# Patient Record
Sex: Male | Born: 1965
Health system: Southern US, Community
[De-identification: ages and names within clinical notes are randomized; demographics above are authoritative.]

## PROBLEM LIST (undated history)

## (undated) DIAGNOSIS — N400 Enlarged prostate without lower urinary tract symptoms: Secondary | ICD-10-CM

## (undated) DIAGNOSIS — F411 Generalized anxiety disorder: Secondary | ICD-10-CM

## (undated) DIAGNOSIS — L309 Dermatitis, unspecified: Secondary | ICD-10-CM

## (undated) DIAGNOSIS — Z87442 Personal history of urinary calculi: Secondary | ICD-10-CM

## (undated) DIAGNOSIS — Z8709 Personal history of other diseases of the respiratory system: Secondary | ICD-10-CM

## (undated) DIAGNOSIS — K429 Umbilical hernia without obstruction or gangrene: Secondary | ICD-10-CM

## (undated) DIAGNOSIS — K219 Gastro-esophageal reflux disease without esophagitis: Secondary | ICD-10-CM

## (undated) DIAGNOSIS — G8929 Other chronic pain: Secondary | ICD-10-CM

## (undated) DIAGNOSIS — N411 Chronic prostatitis: Secondary | ICD-10-CM

## (undated) DIAGNOSIS — Z8719 Personal history of other diseases of the digestive system: Secondary | ICD-10-CM

## (undated) DIAGNOSIS — R399 Unspecified symptoms and signs involving the genitourinary system: Secondary | ICD-10-CM

## (undated) DIAGNOSIS — Z973 Presence of spectacles and contact lenses: Secondary | ICD-10-CM

## (undated) DIAGNOSIS — K649 Unspecified hemorrhoids: Secondary | ICD-10-CM

## (undated) HISTORY — PX: LAPAROSCOPIC CHOLECYSTECTOMY: SUR755

## (undated) HISTORY — PX: LAPAROSCOPIC INGUINAL HERNIA REPAIR: SUR788

## (undated) HISTORY — PX: NISSEN FUNDOPLICATION: SHX2091

## (undated) HISTORY — PX: HERNIA REPAIR: SHX51

## (undated) HISTORY — PX: CHOLECYSTECTOMY: SHX55

## (undated) HISTORY — DX: Gastro-esophageal reflux disease without esophagitis: K21.9

## (undated) HISTORY — PX: INGUINAL HERNIA REPAIR: SUR1180

## (undated) HISTORY — PX: LAPAROSCOPIC NISSEN FUNDOPLICATION: SHX1932

---

## 1999-10-14 ENCOUNTER — Emergency Department (HOSPITAL_COMMUNITY): Admission: EM | Admit: 1999-10-14 | Discharge: 1999-10-14 | Payer: Self-pay | Admitting: *Deleted

## 2000-02-10 ENCOUNTER — Ambulatory Visit (HOSPITAL_COMMUNITY): Admission: RE | Admit: 2000-02-10 | Discharge: 2000-02-10 | Payer: Self-pay | Admitting: Gastroenterology

## 2001-05-21 ENCOUNTER — Ambulatory Visit (HOSPITAL_COMMUNITY): Admission: RE | Admit: 2001-05-21 | Discharge: 2001-05-21 | Payer: Self-pay | Admitting: Gastroenterology

## 2001-05-24 ENCOUNTER — Ambulatory Visit (HOSPITAL_COMMUNITY): Admission: RE | Admit: 2001-05-24 | Discharge: 2001-05-24 | Payer: Self-pay | Admitting: Gastroenterology

## 2001-06-15 ENCOUNTER — Ambulatory Visit (HOSPITAL_COMMUNITY): Admission: RE | Admit: 2001-06-15 | Discharge: 2001-06-15 | Payer: Self-pay | Admitting: Gastroenterology

## 2001-06-30 ENCOUNTER — Observation Stay (HOSPITAL_COMMUNITY): Admission: RE | Admit: 2001-06-30 | Discharge: 2001-07-01 | Payer: Self-pay | Admitting: Surgery

## 2001-06-30 ENCOUNTER — Encounter (INDEPENDENT_AMBULATORY_CARE_PROVIDER_SITE_OTHER): Payer: Self-pay | Admitting: *Deleted

## 2003-02-15 ENCOUNTER — Encounter: Payer: Self-pay | Admitting: Emergency Medicine

## 2003-02-15 ENCOUNTER — Emergency Department (HOSPITAL_COMMUNITY): Admission: EM | Admit: 2003-02-15 | Discharge: 2003-02-15 | Payer: Self-pay | Admitting: Emergency Medicine

## 2003-12-20 ENCOUNTER — Encounter: Admission: RE | Admit: 2003-12-20 | Discharge: 2003-12-20 | Payer: Self-pay | Admitting: Family Medicine

## 2004-02-15 ENCOUNTER — Emergency Department (HOSPITAL_COMMUNITY): Admission: EM | Admit: 2004-02-15 | Discharge: 2004-02-16 | Payer: Self-pay | Admitting: Emergency Medicine

## 2004-02-19 ENCOUNTER — Encounter: Admission: RE | Admit: 2004-02-19 | Discharge: 2004-02-19 | Payer: Self-pay | Admitting: Neurology

## 2004-03-12 ENCOUNTER — Encounter: Admission: RE | Admit: 2004-03-12 | Discharge: 2004-03-12 | Payer: Self-pay | Admitting: Neurology

## 2004-03-27 ENCOUNTER — Encounter: Admission: RE | Admit: 2004-03-27 | Discharge: 2004-03-27 | Payer: Self-pay | Admitting: Neurology

## 2005-05-05 DIAGNOSIS — Z9889 Other specified postprocedural states: Secondary | ICD-10-CM

## 2005-05-05 DIAGNOSIS — Z8719 Personal history of other diseases of the digestive system: Secondary | ICD-10-CM

## 2005-05-05 HISTORY — DX: Other specified postprocedural states: Z98.890

## 2005-05-05 HISTORY — DX: Personal history of other diseases of the digestive system: Z87.19

## 2005-11-29 ENCOUNTER — Emergency Department (HOSPITAL_COMMUNITY): Admission: EM | Admit: 2005-11-29 | Discharge: 2005-11-29 | Payer: Self-pay | Admitting: Emergency Medicine

## 2005-12-22 ENCOUNTER — Ambulatory Visit (HOSPITAL_COMMUNITY): Admission: RE | Admit: 2005-12-22 | Discharge: 2005-12-22 | Payer: Self-pay | Admitting: Gastroenterology

## 2005-12-22 ENCOUNTER — Ambulatory Visit: Payer: Self-pay | Admitting: Gastroenterology

## 2005-12-24 ENCOUNTER — Ambulatory Visit (HOSPITAL_COMMUNITY): Admission: RE | Admit: 2005-12-24 | Discharge: 2005-12-24 | Payer: Self-pay | Admitting: Gastroenterology

## 2005-12-25 ENCOUNTER — Encounter (INDEPENDENT_AMBULATORY_CARE_PROVIDER_SITE_OTHER): Payer: Self-pay | Admitting: Gastroenterology

## 2005-12-25 ENCOUNTER — Ambulatory Visit: Payer: Self-pay | Admitting: Gastroenterology

## 2006-01-08 ENCOUNTER — Ambulatory Visit (HOSPITAL_COMMUNITY): Admission: RE | Admit: 2006-01-08 | Discharge: 2006-01-08 | Payer: Self-pay | Admitting: Gastroenterology

## 2006-01-14 ENCOUNTER — Ambulatory Visit: Payer: Self-pay | Admitting: Gastroenterology

## 2006-01-15 ENCOUNTER — Ambulatory Visit: Payer: Self-pay | Admitting: Internal Medicine

## 2006-01-19 ENCOUNTER — Ambulatory Visit (HOSPITAL_COMMUNITY): Admission: RE | Admit: 2006-01-19 | Discharge: 2006-01-19 | Payer: Self-pay | Admitting: Internal Medicine

## 2006-01-28 ENCOUNTER — Ambulatory Visit: Payer: Self-pay | Admitting: Cardiovascular Disease

## 2006-01-30 ENCOUNTER — Ambulatory Visit: Payer: Self-pay

## 2006-02-02 ENCOUNTER — Ambulatory Visit (HOSPITAL_COMMUNITY): Admission: RE | Admit: 2006-02-02 | Discharge: 2006-02-02 | Payer: Self-pay | Admitting: Gastroenterology

## 2006-02-03 ENCOUNTER — Ambulatory Visit (HOSPITAL_COMMUNITY): Admission: RE | Admit: 2006-02-03 | Discharge: 2006-02-03 | Payer: Self-pay | Admitting: Gastroenterology

## 2006-02-28 ENCOUNTER — Inpatient Hospital Stay (HOSPITAL_COMMUNITY): Admission: RE | Admit: 2006-02-28 | Discharge: 2006-03-03 | Payer: Self-pay | Admitting: Surgery

## 2006-03-04 ENCOUNTER — Ambulatory Visit: Payer: Self-pay | Admitting: Gastroenterology

## 2008-07-31 ENCOUNTER — Observation Stay (HOSPITAL_COMMUNITY): Admission: EM | Admit: 2008-07-31 | Discharge: 2008-08-01 | Payer: Self-pay | Admitting: Emergency Medicine

## 2008-07-31 ENCOUNTER — Ambulatory Visit: Payer: Self-pay | Admitting: Cardiology

## 2008-08-01 ENCOUNTER — Encounter (INDEPENDENT_AMBULATORY_CARE_PROVIDER_SITE_OTHER): Payer: Self-pay | Admitting: Internal Medicine

## 2008-08-09 ENCOUNTER — Emergency Department (HOSPITAL_COMMUNITY): Admission: EM | Admit: 2008-08-09 | Discharge: 2008-08-09 | Payer: Self-pay | Admitting: Emergency Medicine

## 2010-05-05 HISTORY — PX: LIPOMA EXCISION: SHX5283

## 2010-08-15 LAB — COMPREHENSIVE METABOLIC PANEL
Albumin: 3.7 g/dL (ref 3.5–5.2)
Albumin: 4.1 g/dL (ref 3.5–5.2)
Alkaline Phosphatase: 44 U/L (ref 39–117)
BUN: 10 mg/dL (ref 6–23)
CO2: 28 mEq/L (ref 19–32)
Chloride: 108 mEq/L (ref 96–112)
Creatinine, Ser: 1.38 mg/dL (ref 0.4–1.5)
GFR calc Af Amer: >60 mL/min (ref >60)
GFR calc non Af Amer: 57 mL/min — ABNORMAL LOW (ref >60)
Sodium: 138 mEq/L (ref 135–145)
Total Bilirubin: 0.6 mg/dL (ref 0.3–1.2)
Total Bilirubin: 0.8 mg/dL (ref 0.3–1.2)
Total Protein: 6.3 g/dL (ref 6.0–8.3)

## 2010-08-15 LAB — URINE MICROSCOPIC-ADD ON

## 2010-08-15 LAB — CBC
HCT: 46.8 % (ref 39.0–52.0)
Hemoglobin: 15.9 g/dL (ref 13.0–17.0)
Platelets: 192 10*3/uL (ref 150–400)
RDW: 13.4 % (ref 11.5–15.5)
WBC: 7.3 10*3/uL (ref 4.0–10.5)

## 2010-08-15 LAB — DIFFERENTIAL
Basophils Absolute: 0 10*3/uL (ref 0.0–0.1)
Basophils Relative: 1 % (ref 0–1)
Eosinophils Relative: 5 % (ref 0–5)
Lymphocytes Relative: 25 % (ref 12–46)
Lymphocytes Relative: 28 % (ref 12–46)
Lymphs Abs: 1.8 10*3/uL (ref 0.7–4.0)
Monocytes Absolute: 0.6 10*3/uL (ref 0.1–1.0)
Monocytes Absolute: 0.7 10*3/uL (ref 0.1–1.0)
Monocytes Relative: 10 % (ref 3–12)
Neutro Abs: 4.8 10*3/uL (ref 1.7–7.7)
Neutrophils Relative %: 59 % (ref 43–77)

## 2010-08-15 LAB — CARDIAC PANEL(CRET KIN+CKTOT+MB+TROPI)
CK, MB: 1.2 ng/mL (ref 0.3–4.0)
CK, MB: 1.2 ng/mL (ref 0.3–4.0)
Relative Index: 1.2 (ref 0.0–2.5)
Relative Index: 1.2 (ref 0.0–2.5)
Total CK: 100 U/L (ref 7–232)
Total CK: 101 U/L (ref 7–232)
Troponin I: 0.01 ng/mL (ref 0.00–0.06)
Troponin I: 0.02 ng/mL (ref 0.00–0.06)

## 2010-08-15 LAB — BRAIN NATRIURETIC PEPTIDE: Pro B Natriuretic peptide (BNP): <30 pg/mL (ref 0.0–100.0)

## 2010-08-15 LAB — URINALYSIS, ROUTINE W REFLEX MICROSCOPIC
Bilirubin Urine: NEGATIVE
Glucose, UA: NEGATIVE mg/dL
Hgb urine dipstick: NEGATIVE
Specific Gravity, Urine: 1.013 (ref 1.005–1.030)
pH: 7.5 (ref 5.0–8.0)

## 2010-08-15 LAB — POCT I-STAT, CHEM 8
BUN: 12 mg/dL (ref 6–23)
Chloride: 105 mEq/L (ref 96–112)
Potassium: 3.9 mEq/L (ref 3.5–5.1)
Sodium: 140 mEq/L (ref 135–145)
TCO2: 23 mmol/L (ref 0–100)

## 2010-08-15 LAB — TROPONIN I: Troponin I: 0.02 ng/mL (ref 0.00–0.06)

## 2010-08-15 LAB — PROTIME-INR: Prothrombin Time: 13.2 seconds (ref 11.6–15.2)

## 2010-08-15 LAB — POCT CARDIAC MARKERS: Troponin i, poc: <0.05 ng/mL (ref 0.00–0.09)

## 2010-08-15 LAB — CK TOTAL AND CKMB (NOT AT ARMC): Relative Index: 1.2 (ref 0.0–2.5)

## 2010-08-15 LAB — AMYLASE: Amylase: 48 U/L (ref 27–131)

## 2010-08-15 LAB — MAGNESIUM: Magnesium: 2.1 mg/dL (ref 1.5–2.5)

## 2010-09-17 NOTE — H&P (Signed)
[name], [name]               ACCOUNT NO.:  [id number]   MEDICAL RECORD NO.:  [id number]          PATIENT TYPE:  EMS   LOCATION:  ED                           FACILITY:  [facility]   PHYSICIAN:  Ramiro Harvest, MD    DATE OF BIRTH:  [birth year]   DATE OF ADMISSION:  07/31/2008  DATE OF DISCHARGE:                              HISTORY & PHYSICAL   PATIENT'S PRIMARY CARE PHYSICIAN:  C. Duane Lope, M.D. of [facility].   PATIENT'S CARDIOLOGIST:  Veverly Fells. Excell Seltzer, MD of [facility].   PATIENT'S GASTROENTEROLOGIST:  Griffith Citron, M.D.   HISTORY OF PRESENT ILLNESS:  [name] is a 45 year old white  gentleman with history of persisting gastroesophageal reflux disease,  hiatal hernia, history of Nissen fundoplication February 26, 2006 per Dr.  Ezzard Standing, status post cholecystectomy, who presented to the ED with a  several hour history of midsternal/epigastric chest pressure occurring  at rest, nonradiating with associated nausea, pallor, shortness of  breath, palpitations and dizziness.  The patient states that he had  similar symptoms prior to getting his Nissen fundoplication.  The  patient stated that it took a year after the Nissen fundoplication for  his symptoms of back then.  The patient also stated that a few weeks  prior to admission he had some throat clearing that he felt was  secondary to reflux.  The patient denies any fever no chills, no  diarrhea, no emesis, no cough, no melena, no hematemesis, no  hematochezia.  No lower abdominal pain, no weakness, no dysuria, no  focal neurological symptoms.  The patient drove himself to the ED.  In  the ED point of care cardiac markers obtained were negative.  CBC within  normal limits.  I-stat 8 was within normal limits.  EKG with some  nonspecific ST-T wave changes which were unchanged from prior EKG.  The  patient was given some morphine in the ED with no significant relief.  The patient refused an aspirin.  We were  called to admit the patient for  further evaluation and management.   ALLERGIES:  No known drug allergies.  However, the patient states that  he did not tolerate the morphine he was given in the ED too well.   PAST MEDICAL HISTORY.:  1. History of pertinent gastroesophageal reflux disease.  2. Hiatal hernia.  3. Status post laparoscopic Nissen fundoplication per Dr. Ezzard Standing in      February 26, 2006.  4. History of chronic non-bacterial prostatitis, improved per patient.  5. Status post cholecystectomy in 2003.  6. Abdominal hernia repair x2.  7. Questionable anxiety/depression.  The patient however denies this.  8. History of anal fissures which have improved.  9. History of asthmatic irritation secondary to GERD.  10.History of right shoulder ringworm, currently resolved.  11.Status post left-sided resection of lipoma x2 months prior to      admission per Dr. Ezzard Standing.   HOME MEDICATIONS.:  1. Wellbutrin 20 mg p.o. b.i.d.  2. Zegerid 40 mg p.o. daily.  3. Elmiron 100 mg p.o. daily.  4. Selenium one.   FAMILY  HISTORY:  Father alive age 8 or 27 who is healthy.  Mother alive  age 57 with some digestive issues.  One sister alive age 58 with a  history of endometriosis.  Grandparents had hiatal hernia and  gallbladder problems.  No family history of coronary artery disease.   EXAMINATION:  Blood pressure 151/98, pulse of 114 down to 84,  respiratory rate 20.  Satting 100% on room air.  GENERAL:  Patient lying on gurney in no apparent distress.  HEENT: Normocephalic, atraumatic.  Pupils equal, round and reactive to  light and accommodation.  Extraocular movements intact.  Oropharynx is  clear.  No lesions, no exudates.  NECK:  Supple.  No lymphadenopathy.  RESPIRATORY:  Lungs are clear to auscultation bilaterally.  No wheezes,  no crackles, no rhonchi.  CARDIOVASCULAR:  Regular rate, rhythm.  No murmurs, rubs or gallops.  ABDOMEN:  Soft, nontender, nondistended.  Positive bowel  sounds.  EXTREMITIES:  No clubbing, cyanosis or edema.  NEUROLOGICAL:  The patient is alert and oriented x3.  Cranial nerves II-  XII are grossly intact.  No focal deficits.   ADMISSION LABORATORIES:  Point of care cardiac markers:  CK-MB less than  1.0, troponin-I less than 0.05, myoglobin of 70, I-stat 8.  Sodium 140,  potassium 3.9, chloride 105, glucose 100, BUN 12, creatinine 1.5.  CBC  with a white count of 7.3, hemoglobin 15.9, hematocrit 46.8 and an ANC  of 4.4.  Chest x-ray did show no acute findings.  An EKG with  nonspecific ST-T wave abnormalities which is unchanged from prior EKG,  normal sinus rhythm.   ASSESSMENT AND PLAN:  [name] is a 45 year old gentleman with  history of a persistent gastroesophageal reflux disease and hiatal  hernia status post Nissen fundoplication in 2007, no family history of  coronary artery disease, prior tobacco history, presenting to the  emergency department with chest pain/epigastric pain.  1. Chest pain/epigastric pain likely secondary to gastrointestinal      versus acute coronary syndrome.  We will admit the patient to      telemetry.  Cycle cardiac enzymes every 8 hours x3.  Check a TSH.      Check a magnesium level.  Check a BNP. Check a two-dimensional      echocardiogram to rule out left ventricular dysfunction.  Serial      electrocardiograms.  Check a lipase.  Check amylase, check      coagulopathies.  We will place on oxygen, nitroglycerin as needed,      aspirin and Lovenox,  Protonix b.i.d. and a gastrointestinal      cocktail.  If enzymes are negative and some symptomatic improvement      under gastrointestinal cocktail and a proton pump inhibitor, we      will refer for outpatient followup with his gastroenterologist in      the next 1-2 days for further evaluation as the patient states that      he has an appointment with his gastroenterologist on Wednesday,      March 31.  2. Chronic non-bacterial prostatitis.   Continue home dose Elmiron.  3. Hiatal hernia.  Protonix.  4. Status post laparoscopic Nissen fundoplication.  5. Questionable anxiety/depression on Wellbutrin.  However, the      patient states he takes the Wellbutrin to prevent esophageal      spasms.  6. Prophylaxis.  Protonix for gastrointestinal prophylaxis.  Lovenox      for deep vein thrombosis prophylaxis.  Ramiro Harvest, MD  Electronically Signed     DT/MEDQ  D:  07/31/2008  T:  07/31/2008  Job:  [id number]   cc:   C. Duane Lope, M.D.  Fax: [phone]   Veverly Fells. Excell Seltzer, MD  [address]  [place] , [place] [address]   Griffith Citron, M.D.  Fax: [phone]

## 2010-09-17 NOTE — Discharge Summary (Signed)
[name], [name]               ACCOUNT NO.:  [id number]   MEDICAL RECORD NO.:  [id number]          PATIENT TYPE:  OBV   LOCATION:  1414                         FACILITY:  [facility]   PHYSICIAN:  Ramiro Harvest, MD    DATE OF BIRTH:  01-Mar-1966   DATE OF ADMISSION:  07/31/2008  DATE OF DISCHARGE:  08/01/2008                               DISCHARGE SUMMARY   PRIMARY CARE PHYSICIAN:  Dr. Duane Lope of [facility].   GASTROENTEROLOGIST:  Dr. Kinnie Scales of gastroenterology.   Dictation ended at this point.      Ramiro Harvest, MD     DT/MEDQ  D:  08/01/2008  T:  08/01/2008  Job:  [id number]   cc:   C. Duane Lope, M.D.  Fax: [phone]   Griffith Citron, M.D.  Fax: [phone]

## 2010-09-17 NOTE — Discharge Summary (Signed)
NAMEVANDELL, KUN               ACCOUNT NO.:  [id number]   MEDICAL RECORD NO.:  [id number]          PATIENT TYPE:  OBV   LOCATION:  1414                         FACILITY:  [facility]   PHYSICIAN:  Ramiro Harvest, MD    DATE OF BIRTH:  [birth year]   DATE OF ADMISSION:  07/31/2008  DATE OF DISCHARGE:  08/01/2008                               DISCHARGE SUMMARY   PRIMARY CARE PHYSICIAN:  Dr. Duane Lope of [place] [facility].   CARDIOLOGIST:  Veverly Fells. Excell Seltzer, MD of  [facility].   GASTROENTEROLOGIST:  Griffith Citron, M.D.   DISCHARGE DIAGNOSIS:  1. Chest pain likely secondary to gastrointestinal.  2. Chronic nonbacterial prostatitis.  3. Gastroesophageal reflux disease / hiatal hernia.  4. Status post laparoscopic Nissen fundoplication per Dr. Ezzard Standing      February 26, 2006.  5. Status post cholecystectomy in 2003.  6. Abdominal hernia repair x2.  7. Questionable anxiety and depression.  The patient denies this.  8. History of anal fissures which have improved.  9. History of asthmatic irritation secondary to gastroesophageal      reflux disease.  10.History of right shoulder ringworm resolved.  11.Status post left-sided resection of lipoma 2 months ago per Dr.      Ezzard Standing.   DISCHARGE MEDICATIONS:  1. Wellbutrin 20 mg p.o. b.i.d.  2. Zegerid 40 mg p.o. b.i.d. until seen by Dr. Kinnie Scales.  3. Elmiron 100 mg p.o. daily.  4. Selenium 1 tablet p.o. daily   DISPOSITION:  On follow-up the patient will be discharged home.  The  patient is to follow up with Dr. Kinnie Scales,  his gastroenterologist on  August 02, 2008 as previously scheduled for further evaluation and  management.  The patient may need an endoscopy for further evaluation of  his chest pain.  If  gastroenterology workup is negative for his chest  pain, the patient will need to schedule an appointment to be seen by his  cardiologist Dr. Excell Seltzer for further evaluation and management.  The  patient is to follow up with his  PCP in the next 1-2 weeks. The PCP will  need to followup on 2D echocardiogram done during the hospitalization.   CONSULTATIONS DONE:  None.   PROCEDURES PERFORMED:  1. A chest x-ray was done on July 31, 2008 that showed no acute      findings.  2. A 2-D echo was done on August 01, 2008 with normal with preliminary      findings of an EF of  approximately 65%; no wall motion      abnormalities.  No valvular abnormalities.  Formal follow up on 2-D      echo will need to be done per PCP.   BRIEF ADMISSION HISTORY AND PHYSICAL:  Mr. Baruc Tugwell is a 45-year-  old white gentleman history of persistent gastroesophageal reflux  disease, hiatal hernia, status post Nissen fundoplication February 26, 2006 per Dr. Ezzard Standing, status post cholecystectomy who presented to the ED  with a several hour history of midsternal /epigastric chest pressure  occurring at rest, nonradiating with associated nausea, pallor,  shortness of  breath, palpitations and dizziness.  The patient stated  that he had similar symptoms prior to getting his Nissen fundoplication.  The patient stated that it took a year after the Nissen fundoplication  for his symptoms to improve.  The patient also stated that a few weeks  prior to admission he started to have some throat clearing that he felt  was secondary to reflux.  The patient denied any fever, no chills, no  diarrhea, no emesis.  No cough, no melena or hematemesis, no  hematochezia, no lower abdominal pain, no weakness, no dysuria, no focal  neurological symptoms.  The patient drove himself to the ED.  In the ED,  point of care cardiac markers obtained were negative.  CBC was within  normal limits.  I-STAT  8 was within normal limits.  EKG with some  nonspecific ST-T wave changes which are unchanged from prior EKG.  The  patient was given some morphine in the ED with no significant relief.  The patient refused aspirin and we are called to admit the patient for  further  evaluation and management.   PHYSICAL EXAMINATION:  Blood pressure 151/98, pulse of 114 down to 84.  Respiratory rate 27, 100% on room air.  GENERAL:  The patient lying on gurney in no apparent distress.  HEENT:  Normocephalic, atraumatic.  Pupils equal, round and reactive to  light and accommodation.  Extraocular movements intact.  Oropharynx is  clear.  No lesions.  No exudates.  NECK:  Supple.  No lymphadenopathy.  RESPIRATORY:  Lungs were clear to auscultation bilaterally.  No wheezes,  no crackles, no rhonchi.  CARDIOVASCULAR:  Regular rate and rhythm.  No murmurs, rubs or gallops.  ABDOMEN:  Soft, nontender, nondistended, positive bowel sounds.  EXTREMITIES:  No clubbing, cyanosis or edema.  NEUROLOGIC:  The patient was alert and oriented x3.  Cranial nerves II-  XII are grossly intact.  No focal deficits.   ADMISSION LABS:  Point of care cardiac markers CK-MB less than 1,  troponin-I less than 0.05, myoglobin of 70, i-STAT 8.  Sodium 140,  potassium 3.9, chloride 105, glucose 100, BUN 12, creatinine 1.5.  CBC  with a white count of 7.3, hemoglobin 15.9, hematocrit 46.8, ANC of 4.4.   Chest x-ray as stated above.  EKG with nonspecific ST T-wave  abnormalities, unchanged from prior EKG, normal sinus rhythm.   HOSPITAL COURSE:  1. Chest pain/ epigastric pain.  The patient was brought in for chest      pain rule out myocardial infarction. It was felt on admission the      patient's chest pain was likely secondary to gastrointestinal      versus an acute coronary syndrome.  The patient was admitted to      telemetry bed.  Cardiac enzymes were cycled q.8 h x3 which were      negative.  A BNP was obtained which was within normal limits.  TSH      was also obtained which was within normal limits at 0.645 and      magnesium level was obtained at 2.18.  BNP was less than 30.  The      patient was placed on oxygen, aspirin as well as twice daily proton      pump inhibitors as well as a  GI cocktail every 8 hours as needed.      The patient improved symptomatically with this regimen and was      monitored.  A 2-D echo  was obtained on the day of discharge and a 2-      D echo was not formally read but preliminary results were an EF of      65% with no wall motion abnormalities and no valvular abnormalities      per cardiologist reading the 2-D echo.  A formal  report will need      to be followed up per PCP.  The patient improved symptomatically      throughout the hospitalization and the patient will be discharged      in stable and improved condition with close follow-up with his      gastroenterologist Dr. Kinnie Scales on August 02, 2008 as previously      scheduled.  If the patient's GI workup is negative, may consider      outpatient following up with his cardiologist Dr. Excell Seltzer for      further evaluation and management.  It was a pleasure taking care      of Mr. Hairo Garraway.   On the day of discharge, vital signs:  Temperature 98.0, blood pressure  123/77, pulse of 65, respiratory rate 18, saturating 100% on room air.   Discharge labs:  Sodium 138, potassium 3.9, chloride 107, bicarb 28, BUN  11, creatinine 1.36, glucose of 111, calcium of 9.0, bilirubin 0.6, alk  phosphatase 44, AST 28, ALT 57, albumin of 3.7, protein of 6.3.  CBC  with a white count of 8.1, hemoglobin 14.9, platelets 192, hematocrit  42.7, ANC of 4.8.  Lipase of 23, amylase of 48.   It was a pleasure taking care of Mr. Atif Chapple.      Ramiro Harvest, MD  Electronically Signed     DT/MEDQ  D:  08/01/2008  T:  08/01/2008  Job:  [id number]   cc:   C. Duane Lope, M.D.  Fax: [phone]   Veverly Fells. Excell Seltzer, MD  [address]  [place], [place] [address]   Griffith Citron, M.D.  Fax: [phone]

## 2010-09-20 NOTE — Assessment & Plan Note (Signed)
[facility]                           GASTROENTEROLOGY OFFICE NOTE   NAME:[name] [name] LUNA                      MRN:          [id number]  DATE:12/22/2005                            DOB:          11-05-1965    [name] [name] comes in and says he cannot stand the acid reflux, it has gotten  worse.  It is much more severe and wakes him up at night.  He gets tightness  in his chest, gets sour brash.  He says it discolors his teeth, awakens him  from sleep.  He gets clearing of his throat, not so much coughing, but he  does get some coughing as well.  He is not eating before going to bed at  night, being careful what he eats, trying to cut back on his dinner, on the  amounts especially, and not to eat 3 hours before he goes to bed at night.  I told him he needs to raise his bed up to 6-8 inches.   PHYSICAL EXAMINATION:  Weight 186, blood pressure 120/78, pulse 60 and  regular.  Head, neck and extremities are all unremarkable.   IMPRESSION:  1. Gastroesophageal reflux disease with recent exacerbation of symptoms in      a patient with known small hiatal hernia.  2. Anxiety and depression.  3. Status post cholecystectomy.  4. History of recurrent anal fissures; however, he denies any symptoms at      this time.   RECOMMENDATIONS:  1. Get a 24-hour pH study.  2. Get a barium swallow.  3. Get routine labs.  4. Put him on some Reglan 10 mg at dinner and at bedtime.  He says he has      tried this before.  He got diarrhea from it but, hopefully, this will      be better.  5. We will schedule him for an endoscopy as a chest x-ray is not      indicated.  He did note that he had some dysphagia, so we will check      this as well at the time of his endoscopy.                                   Ulyess Mort, MD   SML/MedQ  DD:  12/22/2005  DT:  12/23/2005  Job #:  [id number]

## 2010-09-20 NOTE — Op Note (Signed)
NAMEZACHARIA, [name]               ACCOUNT NO.:  [id number]   MEDICAL RECORD NO.:  [id number]          PATIENT TYPE:  INP   LOCATION:  1612                         [facility]ACILITY[facility]  [facility]   PHYSICIAN:  Sandria Bales. Ezzard Standing, M.D.  DATE OF BIRTH:  03-Apr-1966   DATE OF PROCEDURE:  02/27/2006  DATE OF DISCHARGE:                                 OPERATIVE REPORT   PREOPERATIVE DIAGNOSIS:  Gastroesophageal reflux disease.   POSTOPERATIVE DIAGNOSIS:  Gastroesophageal reflux disease.   PROCEDURE:  Laparoscopic Nissen fundoplication over a #56 bougie.   SURGEON:  Sandria Bales. Ezzard Standing, M.D.   ASSISTANT:  Ardeth Sportsman, MD   ANESTHESIA:  General endotracheal anesthesia.   ESTIMATED BLOOD LOSS:  Less than 50 mL.   DRAINS LEFT IN:  None.   HISTORY OF ILLNESS:  [name] is a 45 year old white male who is a patient  of Dr. Miguel Aschoff and followed by Dr. Terrial Rhodes.  Patient has had  trouble with reflux, going on for 7 or 8 years.  He had a laparoscopic  cholecystectomy in 2003 without any improvement in his symptoms.  He has  been through evaluations which has included an upper GI which shows a  patulous esophageal junction with spontaneous gastroesophageal reflux.  He  had a Bravo probe which was a moderate abnormality with a pH less than four  13% of the time; with normal being less than four 5.5% of the time.  He had  an upper endoscopy that showed a 2 cm hiatal hernia and some erythema of his  antrum.   The indications and potential complications were explained to the patient.  Potential complications include, but not limited to bleeding, infection,  bowel injury, the possible of open surgery and the possibility of failure of  the Nissen either now or later to control the symptoms.  I also discussed  that a large percent of lap Nissen patients have to stay on their anti  reflux medication.   OPERATIVE NOTE:  The patient had both of his arms tucked to his side.  He  was given 1 gram of Ancef at  the initiation of the procedure.  He had a  Foley catheter in place.  His abdomen was shaved, prepped with Betadine  solution and sterilely draped.  I accessed the abdominal cavity through the  left upper quadrant using an OptiVu.  I used a total of 6 trocars.  I used a  5 mm left lateral trocar, the OptiVu already previously mentioned, a 10/11  left paramedian, a 10/11 right paramedian, a 5 mm right subcostal and a 5 mm  for the Nathanson retractor.   Right and left lobes of the liver were unremarkable.  The stomach was  unremarkable.  The bowel that I could see was unremarkable.  I placed the  Nathanson retractor under the left lobe of the liver which gave good  exposure to his hiatus.  I then dissected along the right crus identifying  the esophagus.  I thought that I saw the posterior vagus nerve and tried to  occlude that with the dissection.  I went up into the mediastinum  posteriorly.  I then took the peritoneal reflection over the esophagus  anteriorly and down the left crus.  I tried to mobilize about 6-7 cm of  esophagus.  I then took the fundus of the stomach, and I mobilized the upper  one-third of the stomach taking down the short gastrics using harmonic  scalpel. I then was able to pass the stomach behind the esophagus without  any tension.   I then turned my attention first to closing the crura.  I placed 3 crural  sutures, narrowing the crura down to approximately 2.5 to 3 cm.  I passed  the 56 bougie which filled up the entire hiatus.  I then passed the  fundoplication posterior to the esophagus and I brought this anteriorly and  then used a 2-0 Ethibond suture catching the left side of the stomach, the  anterior esophagus, and the right side on the stomach trying to get the wrap  up above 2-3 cm above the gastroesophageal reflux disease.   I put 3 separate sutures each time grabbing stomach-esophagus-stomach.  I  marked these sutures with clips.  The #56 bougie was  then removed.  This  made for a floppy wrap.  I did take 1 single tacking suture of the posterior  stomach and attached it to the base of the crus.  I did take photos and put  these in the chart.  Again, I thought that I had a good crural wrap.  I had  an adequate esophageal mobilization.  I had taken down the short gastrics  and had a floppy Nissen.   The patient tolerated the procedure well.  Sponge and needle count were  correct at the end of the case.  I irrigated the wound with 500 mL of  saline; and the patient was transferred to the recovery room in good  condition.      Sandria Bales. Ezzard Standing, M.D.  Electronically Signed     DHN/MEDQ  D:  02/27/2006  T:  03/01/2006  Job:  [id number]   cc:   Ulyess Mort, MD  [address]  [place]  [place] [address]   C. Duane Lope, M.D.  Fax: [phone]

## 2010-09-20 NOTE — Assessment & Plan Note (Signed)
[facility]                           GASTROENTEROLOGY OFFICE NOTE   NAME:[name], [name]                        MRN:          [id number]  DATE:01/13/2006                            DOB:          [birth year]    PROCEDURE:  Bravo 48-hour pH capsule study.   PRIMARY GASTROENTEROLOGIST:  Ulyess Mort, M.D.   A Bravo 48-hour pH study was performed in the usual fashion.  The GE  junction was located endoscopically and the pH capsule was placed  transorally 6 cm above the GE junction in the usual fashion.   The study showed that both day 1 and day 2 his fraction of time with an  acidic pH less than 4 was quite above normal.  Day 1 fraction of time was  10.4%, day 2 this was 13.5%.  Normal is less than 5.5% of the time.  His  total fraction of time with his pH less than 4 was 12.  His DeMeester score  was 44.2 and a normal is less than 14.72.   These results confirm pathologic acid reflux.  Note that this study was done  off PPI medicines and other antacid medicines.                                   Rachael Fee, MD   DPJ/MedQ  DD:  01/13/2006  DT:  01/14/2006  Job #:  [id number]   cc:   Ulyess Mort, MD

## 2010-09-20 NOTE — Letter (Signed)
January 28, 2006     [name] Mort, MD  [address]  [place], [place] [address]   RE:  [name], [name]  MRN:  [id number]  /  DOB:  [birth year]   Dear Dr. Corinda [name]:   It was my pleasure to see [name] this afternoon at the [facility].  As you know, [name] is a very pleasant 45 year old  man who you have been following for some time for presumed reflux symptoms.  He has undergone extensive GI testing including a Bravo-48 hour PH capsule  study.  He also has had upper endoscopy.  My understanding is that he does  have some abnormalities on his GI studies with a small hiatal hernia and  evidence of esophageal reflux.  However, his symptoms seem to be out of  proportion to the objective findings.   He complains of a 6 year history of chest pressure and tightness as well as  difficulty breathing.  His symptoms are most significant after eating and  they are relieved with belching.  At times the episodes last up to several  hours.  Certain foods seem to exacerbate his symptoms more than others.  His  symptoms are more severe when he is in the supine position.  He initially  took once daily AcipHex which provided some relief.  He then increased his  AcipHex to twice daily, but he has continued to have dramatic symptoms.   He also complains of exertional symptoms.  He has not exercised in the last  few months because the last time he went out to play tennis he developed  dyspnea and chest tightness.  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   He denies palpitations or orthopnea, PND, light headedness, syncope or  edema.   PAST MEDICAL HISTORY:  1. Pertinent for gastroesophageal reflux.  2. Hiatal hernia.  3. Chronic nonbacterial prostatitis.  4. Cholecystectomy.  5. Abdominal hernia repair x2.   SOCIAL HISTORY:  The patient is single.  He has no children.  He does not  smoke or use drugs.  He drinks alcohol  occasionally.  As above, he is active  but is not engaged in formal exercise.   FAMILY HISTORY:  His parents are ages 60 and 50 and are alive and well.  He  has a 14 year old sister who is healthy.  There is no coronary artery  disease in his family.   MEDICATIONS:  1. AcipHex 40 mg daily.  2. Elmiron 100 mg daily.   ALLERGIES:  None.   REVIEW OF SYSTEMS:  A complete 12 point review of systems was performed.  Pertinent and positives include fatigue and the symptoms described above.  There were no other positive findings.   PHYSICAL EXAMINATION:  Patient is alert and oriented, healthy-appearing  young man in no acute distress.  Weight 177 pounds.  Height 6 feet 1 inches.  Blood pressure 134/78.  Heart  rate 65.  Respiratory rate 12.  EYES:  Sclerae anicteric.  Conjunctiva pink.  Pupils equal, round and  reactive to light.  ENT:  Oropharynx is clear.  Moist oral mucosa.  NECK:  Normal carotid upstrokes without bruits.  Jugular venous pressure is  normal.  There is no cervical lymphadenopathy.  LUNGS:  Clear to auscultation bilaterally.  CARDIOVASCULAR:  The apex is discrete and nondisplaced.  Heart is regular  rate and rhythm without murmurs or gallops.  ABDOMEN:  Soft, nontender.  No organomegaly.  No abdominal bruits.  EXTREMITIES:  No clubbing, cyanosis or edema.  Peripheral pulses are 2+ and  equal throughout.  There are no femoral bruits.  SKIN:  Is warm and dry without rash.  NEUROLOGIC:  Is grossly intact with 5/5 motor strength in the arms and legs  bilaterally.   EKG demonstrates a normal sinus rhythm and is within normal limits.   ASSESSMENT:  [name] is a 45 year old male with highly symptomatic chest  pressure and shortness of breath. I think his symptoms most likely reflect  gastroesophageal reflux and/or of a gastrointestinal etiology.  However, he  does have an exertional component to his symptom complex.  He also clearly  has symptoms out of proportion to the  findings on his recent  gastrointestinal testing.  I think it is reasonable to proceed with an  exercise Myoview stress test to rule out any significant obstructive  coronary artery disease.  I will follow up with him after the results of  this study are available.  With his lack of cardiovascular risk factors, I  did not make any other medical recommendations, as he really does not meet  any criteria for needing risk factor modification.  That could all change if  his stress test is abnormal but we will wait for the results of that to  become available.   Thanks once again for allowing me to evaluate [name]. Please feel free to  call at any time with questions regarding his care.    Sincerely,      Veverly Fells. Excell Seltzer, MD    MDC/MedQ  DD:  01/28/2006  DT:  01/30/2006  Job #:  [id number]

## 2010-09-20 NOTE — Op Note (Signed)
[facility]  Patient:    [name], [name] Visit Number: [id number] MRN: [id number]          Service Type: SUR Location: 3W [id number] [id number] Attending Physician:  Andre Lefort Dictated by:   Sandria Bales. Ezzard Standing, M.D. Proc. Date: 06/30/01 Admit Date:  06/30/2001 Discharge Date: 07/01/2001   CC:         Leonette Most A. Tenny Craw, M.D.  Ulyess Mort, M.D. [facility]   Operative Report  DATE OF BIRTH:  1965-10-07  PREOPERATIVE DIAGNOSIS:  Biliary dyskinesia.  POSTOPERATIVE DIAGNOSIS:  Biliary dyskinesia and small left inguinal hernia.  OPERATION PERFORMED:  Laparoscopic cholecystectomy with intraoperative cholangiogram.  SURGEON:  Sandria Bales. Ezzard Standing, M.D.  ASSISTANT:  Angelia Mould. Derrell Lolling, M.D.  ANESTHESIA:  General endotracheal.  ESTIMATED BLOOD LOSS:  Minimal.  INDICATIONS FOR PROCEDURE:  The patient is a 45 year old white male who is a patient of Dr. Duane Lope and Dr. Victorino Dike.  He has been plagued for about the last year and a half with back pain, tachycardia, nausea, sometimes retrosternal pain which seems to be precipitated by eating certain foods, particularly chocolate and fatty foods.  He has undergone a complete evaluation by Dr. Corinda Gubler which has essentially been negative except that a hepatobiliary scan showed a decreased gallbladder ejection fraction to 35%.  I have discussed with the patient the potential benefits and indications for gallbladder surgery and the potential risks and the possibility that this may not solve his abdominal pain syndrome.  The patient decided to go head with attempted cholecystectomy.  DESCRIPTION OF PROCEDURE:  The patient was placed in supine position and given a general endotracheal anesthetic.  He had a gram of Ancef at the initiation of the procedure.  PAS stockings were in place.  His abdomen was shaved, prepped with a Betadine solution and sterilely draped.  An infraumbilical incision was made with sharp  dissection carried down to the abdominal cavity. A 0 degree 10 mm laparoscope was inserted through a 12 mm Hasson trocar.  The Hasson trocar was secured with a Vicryl suture.  Exploration carried out revealed the right and left lobe of the liver were grossly normal.  The anterior wall of the stomach was unremarkable.  I could see part of the colon.  The right colon actually had an adhesive band along the right colonic gutter but this did not appear abnormal and he has probably had it his whole life.  I could not see where his right inguinal hernia repair because of this band of the right colon but it looks like he had an early left inguinal hernia.  He had no other mass, nodularity or lesion.  There was nothing abnormal either about his cecum or his right colon.  I then turned my attention to the gallbladder.  Three additional trocars were placed.  A 10 mm Ethicon trocar in the subxiphoid location, a 5 mm Ethicon trocar in the right midsubcostal and a 5 mm Ethicon trocar in the right lateral subcostal.  The gallbladder was identified and grasped and rotated cephalad.  The gallbladder grossly looked normal.  There were no adhesions to it, nor was there any evidence of chronic inflammation grossly.  The gallbladder and cystic duct were dissected out.  The patient had a reasonably long cystic duct probably on the order of 2 to 3 cm in length.  I placed a clip on the gallbladder side and shot an intraoperative cholangiogram.  The intraoperative cholangiogram was obtained using a  cut off taut catheter inserted through a 14 gauge Jelco catheter into the abdominal cavity.  The cystic duct was then cut.  I tried to milk back the cystic duct and got no stones and debris.  I then placed the taut catheter into the cystic duct and secured it with an Endoclip.  Then under fluoroscopy I injected about 6 to 8 cc of half strength Hypaque solution into the cystic duct.  This flowed freely into the  common bile duct into the duodenum.  There was no obstruction and no mass and the ____________ back into the hepatic radicals.  This was felt to be a normal intraoperative cholangiogram.  The taut catheter was then removed.  The cystic duct triply endoclipped and divided.  Cystic artery had been identified along the anterior wall of the gallbladder and a posterior branch was also found.  Both of these were doubly endoclipped and divided.  The gallbladder was then sharply and bluntly dissected from the gallbladder bed.  Bovie electrocautery was used primarily to dissect out the gallbladder.  A few bleeders in the gallbladder bed were coagulated.  The gallbladder was then divided, delivered through the umbilicus through an Endocatch bag.  I revisualized the gallbladder bed and triangle of Calot.  There was no bleeding at this point and there was no bile leak.  The abdomen was irrigated with saline.  Each trocar site was visualized.  There was no bleeding at any trocar site.  The umbilical port was closed with a 0 Vicryl suture.  The other trocars were just removed.  The skin at each site was closed with 5-0 Vicryl suture, painted with tincture of benzoin, Steri-Strips and sterilely dressed.  The patient tolerated the procedure well and was transported to the recovery room in good condition.  The sponge and needle counts were correct at the end of the case.  The gallbladder was sent to pathology. Dictated by:   Sandria Bales. Ezzard Standing, M.D. Attending Physician:  Andre Lefort DD:  06/30/01 TD:  06/30/01 Job: [id number]

## 2010-09-20 NOTE — Discharge Summary (Signed)
[name], [name]               ACCOUNT NO.:  [id number]   MEDICAL RECORD NO.:  [id number]          PATIENT TYPE:  INP   LOCATION:  1612                         FACILITY:  [facility]   PHYSICIAN:  Sandria Bales. Ezzard Standing, M.D.  DATE OF BIRTH:  1966/01/22   DATE OF ADMISSION:  02/27/2006  DATE OF DISCHARGE:  03/03/2006                                 DISCHARGE SUMMARY   DISCHARGE DIAGNOSES:  1. Significant gastroesophageal reflux disease.  2. Asthmatic irritation secondary to reflux.  3. Prostatitis on Elmiron.  4. Right shoulder ringworm.   OPERATIONS PERFORMED:  The patient had a laparoscopic Nissan fundoplication  over 56 bougie on February 26, 2006.   HISTORY OF PRESENT ILLNESS:  [name] is a 45 year old white male who is a  patient of Dr. Miguel Aschoff who has seen Dr. Victorino Dike and his partners for  a long-standing gastroesophageal reflux disease.  He has had symptoms which  had become increasingly severe over the last 6-8 years.   An upper GI showed a patulous lower esophageal sphincter with free reflux of  contrast up the esophagus on upper GI.  He has a manometry with some what  high lower esophageal sphincter tone, but otherwise, normal esophagus.  He  had a Bravo study which showed moderate amount of reflux.  Discussion was  carried out with the patient about the indication and potential  complications of surgery and the benefits of going ahead with laparoscopic  Nissen.   The patient has some mild asthma which appears to be related to his reflux,  and he has prostatitis on Elmiron.  On admission, the patient underwent a  laparoscopic Nissen fundoplication over a 56 bougie.  Procedure went well.  He had significant epigastric discomfort for about 36 hours, but this has  slowly gotten better.  He is now four days postop.  He is taking liquids  well.  He is afebrile.  His white blood count is 6400, and he is ready for  discharge.   On discharge, he will be given liquids Tylenol  #3 for pain.  He will be  given Phenergan suppositories for nausea.  He will stay on full liquids for  at least two weeks, and he can take his Aciphex that he is already on and  his other medications.  He will see me back in two weeks for follow up.      Sandria Bales. Ezzard Standing, M.D.  Electronically Signed     DHN/MEDQ  D:  03/03/2006  T:  03/04/2006  Job:  [id number]   cc:   Miguel Aschoff, M.D.  Fax: [phone]   Ulyess Mort, MD  [address]  [place]  [place] [address]

## 2010-09-20 NOTE — Assessment & Plan Note (Signed)
[facility]                           GASTROENTEROLOGY OFFICE NOTE   NAME:[name], [name]                      MRN:          [id number]  DATE:01/15/2006                            DOB:          1965-09-27    CHIEF COMPLAINT:  Reflux, shortness of breath, bloating, epigastric pain.   HISTORY:  Mr. Kemmerer is a patient of Dr. Blossom Hoops.  Dr. Corinda Gubler is out of  town.  Mr. Balboa had a BRAVO 48-hour pH capsule study recently and the  dictated report is reviewed.  He did this off of PPI medicines and he had  some evidence of pathologic reflux with a DeMeester score of 44.2 with  normal less than 14.72.  I do not have a diary to see about symptom  correlation.  He continues with years of symptoms of bloating and shortness  of breath.  His heartburn symptoms have been under control on AcipHex,  though he was off it for about nine days due to the study and he had a raw  feeling in his epigastric area that seems to be improved.  In the past, he  has had persistent problems, he wakes up at night with tightness in his  chest and sour brash and what I hear is more of a symptom of bloating and  shortness of breath and he says that he is miserable with this.  He does not  seem to have increased flatus.  He says he feels like he needs to belch and  he cannot and there is a lot of pressure building up in the epigastrium.  Most recent upper endoscopy by Dr. Corinda Gubler demonstrated a very small hiatal  hernia about 2 cm in length and some erythematous and granular mucosa in the  antrum and the body of the stomach and granular mucosa in the small-bowel.  Biopsies of his hiatal hernia sac showed no pathologic diagnosis.  He is  maintained on AcipHex 20 mg b.i.d. and he also takes Elmiron 100 mg daily.  He says he does not have interstitial cystitis but that he was placed on  that after responding to it for a study with nonbacterial chronic  prostatitis.   OTHER MEDICAL  PROBLEMS:  1. Anxiety and depression.  2. Cholecystectomy for gallbladder dyskinesia.  3. History of anal fissures.   Dr. Corinda Gubler recommended Reglan at his office visit on December 22, 2005, but  he did not do so as he has been on that in the past and it caused diarrhea.  His symptoms really go back to 2000 but over the last two to three years he  has been miserable.  He has elevated the head of his bed.  He says that  helps some but not completely.  He avoids caffeine and chocolate.  He is  quite distressed and says that he is miserable.  Note that he has been to  the emergency department for breathing disturbance associated with this and  had evaluations for myocardial problems and lung problems and apparently  that has been okay though he has never seen a pulmonologist.  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  He also had esophageal manometry that was  normal in 2003.  The Bernstein test symptom reports showed that he developed  spasm or pain prior to the hydrochloric acid starting and that he had  clearing of his throat symptoms and that he had pain off and on whether or  not he was having hydrochloric acid is the way I interpret the log that is  recorded.   MEDICATIONS:  1. AcipHex 20 mg b.i.d.  2. Elmiron 100 mg daily.   ALLERGIES:  NO KNOWN DRUG ALLERGIES.   He denies overt or underlying anxiety though he admits to being stressed and  anxious about his problems.   PHYSICAL EXAMINATION:  GENERAL APPEARANCE:  No acute distress.  VITAL SIGNS:  Weight 180 pounds which is stable to slightly decreased but  stable over time.  Blood pressure 100/68, pulse 48.  ABDOMEN:  Soft with mild epigastric tenderness.  Bowel sounds are present.   ASSESSMENT:  He has a group of symptoms, some of which are gastrointestinal  in origin.  He does have pathologic acid reflux on his pH study but I am not  convinced his symptoms all come from that.  Some of them are quite atypical,  including the  bloating and pressure.  In fact, most of his symptoms are  atypical.  He is extremely interested in a fundoplication procedure and he  has discussed this with Dr. Corinda Gubler in the past as well.  He really would  like something done quickly and I explained to him that given his lack of  significant response to b.i.d. AcipHex that it would be less likely that the  fundoplication would him and that I think he would be at increased risk of  gas bloat syndrome given these problems.  I suggested a gastric emptying  study which he will study.  I also suggested he consider a referral to  [facility] to Dr. Alycia Rossetti or Dr. Jacqulyn Bath for a functional upper gastrointestinal  disturbance evaluation.  Note that he had a normal esophagogram with trace  esophageal reflux on barium swallow on December 24, 2005.  Additionally, he  has called back and since I did offer, he could meet with a surgeon.  He  wants to see Dr. Ezzard Standing, his surgeon, regarding a possible fundoplication.  I took a great deal of time to explain to him that he has these problems for  years and though I recognize they bother him quite a bit, there does not  seem to be any serious health issue here; i.e., life threatening situation.  I told him that we would work to try to help him, that there was no magic  bullet as far as medication is concerned that I knew of.  I will discuss  this with Dr. Corinda Gubler further.  The dyspnea may need evaluation from a  pulmonary physician.  Perhaps he has has a lot of nonacid reflux as well  since AcipHex does not eliminate his symptoms nor have other proton pump  inhibitors.   Since Reglan has caused diarrhea, domperidone may be an option in the  future.                                   Iva Boop, MD,FACG   CEG/MedQ  DD:  01/15/2006  DT:  01/16/2006  Job #:  [id number]   cc:   Joni Reining.  Corinda Gubler, MD  Sandria Bales. Ezzard Standing, M.D.  Vianne Bulls, M.D.

## 2010-09-20 NOTE — Op Note (Signed)
[facility]  Patient:    [name], [name] Visit Number: [id number] MRN: [id number]          Service Type: END Location: ENDO Attending Physician:  Charmaine Downs Dictated by:   Vania Rea. Jarold Motto, M.D. [facility] Proc. Date: 06/05/01 Admit Date:  05/24/2001 Discharge Date: 05/24/2001   CC:         Ulyess Mort, M.D. [facility]   Operative Report  PROCEDURE:  Esophageal manometry was completed on June 05, 2001. Results of the manometry are as follows:  1. Upper esophageal sphincter:  There appears to be normal coordination    between pharyngeal contraction and cricopharyngeal relaxation. 2. Lower esophageal sphincter:  Lower esophageal sphincter pressure is    somewhat difficult to determine because there appears to be no real    baseline values recorded.  I estimate the pressure to be approximately    20-25 mmHg with what appears to probably be normal relaxation of    swallowing. 3. Motility pattern:  The normally propagated peristaltic waves of normal    amplitude and duration throughout the length of the esophagus, both wet and    dry swallows.  Main distal esophageal amplitude is 85 mm with 100%    peristaltic contractions.  ASSESSMENT: Normal esophageal manometry without evidence of an esophageal motility disturbance to account for this patients symptomatology.    Esophageal manometry was completed on June 05, 2001.  Res Dictated by:   Vania Rea. Jarold Motto, M.D. [facility] Attending Physician:  Charmaine Downs DD:  06/11/01 TD:  06/12/01 Job: [id number]

## 2010-09-20 NOTE — Op Note (Signed)
NAMERHYSE, LOUX               ACCOUNT NO.:  [id number]   MEDICAL RECORD NO.:  [id number]          PATIENT TYPE:  OIB   LOCATION:  1612                         FACILITY:  [facility]   PHYSICIAN:  Barbette Hair. Arlyce Dice, MD,FACGDATE OF BIRTH:  1965-10-24   DATE OF PROCEDURE:  02/27/2006  DATE OF DISCHARGE:                                 OPERATIVE REPORT   PROCEDURE:  An esophageal monometry was performed in the usual fashion with  a pullback technique,   FINDINGS:  1. Upper esophageal sphincter pressure, contraction, sphincter relaxation      were normal.  2. There was 100% peristaltic contractions throughout the body of the      esophagus.  3. LVS resting pressure was 22.5 mm.  Residual pressure was 4.4 mm with      percent relaxation of 75%.   IMPRESSION:  Normal esophageal monometry with the exception of incomplete  relaxation of the lower esophageal sphincter.      Barbette Hair. Arlyce Dice, MD,FACG  Electronically Signed     RDK/MEDQ  D:  02/27/2006  T:  02/28/2006  Job:  [id number]   cc:   Ulyess Mort, MD  Sandria Bales. Ezzard Standing, M.D.

## 2010-09-20 NOTE — Procedures (Signed)
[facility]  Patient:    [name], [name] Visit Number: [id number] MRN: [id number]          Service Type: END Location: ENDO Attending Physician:  Charmaine Downs Dictated by:   Vania Rea. Jarold Motto, M.D. [facility] Proc. Date: 06/17/01 Admit Date:  05/24/2001 Discharge Date: 05/24/2001   CC:         Ulyess Mort, M.D. [facility]   Procedure Report  PROCEDURE PERFORMED:  Esophageal manometry.  Esophageal manometry was completed on May 24, 2001.  Results of manometry are as follows:  1 - Upper esophageal sphincter.  There appears to be normal coordination between pharyngeal contractions and cricopharyngeal relaxation.  2 - Lower esophageal sphincter.  Mean pressure appears to be normal at 20 mHg with normal relaxation and swallowing.  3 - Motility pattern.  There was normal peristalsis throughout the length of the esophagus with both wet and dry swallows.  Mean amplitude of contractions is approximately 85 mHg without evidence of esophageal spasm or dysmotility.  ASSESSMENT:  This was normal esophageal manometry without evidence of esophageal motility disturbance that would explain this patients symptomatology. Dictated by:   Vania Rea. Jarold Motto, M.D. [facility] Attending Physician:  Charmaine Downs DD:  06/17/01 TD:  06/17/01 Job: [id number]

## 2012-12-27 ENCOUNTER — Encounter (INDEPENDENT_AMBULATORY_CARE_PROVIDER_SITE_OTHER): Payer: Self-pay | Admitting: General Surgery

## 2012-12-27 ENCOUNTER — Ambulatory Visit (INDEPENDENT_AMBULATORY_CARE_PROVIDER_SITE_OTHER): Payer: BC Managed Care – PPO | Admitting: General Surgery

## 2012-12-27 VITALS — BP 120/78 | HR 68 | Temp 97.7°F | Resp 14 | Ht 73.0 in | Wt 194.8 lb

## 2012-12-27 DIAGNOSIS — K645 Perianal venous thrombosis: Secondary | ICD-10-CM

## 2012-12-27 NOTE — Patient Instructions (Signed)
Bedrest for next 2-3 days Alternate ice packs and tucks pads to area Warm tub soaks 2-3 times a day Use baby wipes after bm's

## 2012-12-28 ENCOUNTER — Encounter (INDEPENDENT_AMBULATORY_CARE_PROVIDER_SITE_OTHER): Payer: Self-pay | Admitting: Surgery

## 2012-12-28 ENCOUNTER — Ambulatory Visit (INDEPENDENT_AMBULATORY_CARE_PROVIDER_SITE_OTHER): Payer: BC Managed Care – PPO | Admitting: Surgery

## 2012-12-28 VITALS — BP 118/78 | HR 76 | Resp 14 | Ht 73.0 in | Wt 194.6 lb

## 2012-12-28 DIAGNOSIS — K645 Perianal venous thrombosis: Secondary | ICD-10-CM

## 2012-12-28 NOTE — Patient Instructions (Signed)
Follow up if symptoms worsens.

## 2012-12-28 NOTE — Progress Notes (Signed)
Patient ID: [name]., male   DOB: 10/25/1965, 47 y.o.   MRN: [id number]  Chief Complaint  Patient presents with  . New Evaluation    eval throm hem    HPI [name]. is a 47 y.o. male.  We are asked to see the pt in consultation by Dr. Tenny Craw to evaluate him for hemorrhoids. The pt is a 47 yo wm who was lifting a cooler 2 days ago when he felt a stinging sensation at his rectum. He denies any bleeding. He denies any significant pain but only has some mild discomfort associated with it. He is able to move around very comfortably with no limitations. His appetite is good and his bowels are moving normally. He denies any constipation  HPI  Past Medical History  Diagnosis Date  . GERD (gastroesophageal reflux disease)     Past Surgical History  Procedure Laterality Date  . Cholecystectomy    . Hernia repair      *2 in same spot  . Nissen fundoplication      Family History  Problem Relation Age of Onset  . Cancer Father     thyroid and prostate    Social History History  Substance Use Topics  . Smoking status: Former Smoker    Quit date: 05/05/1997  . Smokeless tobacco: Never Used  . Alcohol Use: Yes     Comment: occasionall    Not on File  Current Outpatient Prescriptions  Medication Sig Dispense Refill  . buPROPion (WELLBUTRIN) 100 MG tablet Take 100 mg by mouth 2 (two) times daily.      Marland Kitchen omeprazole-sodium bicarbonate (ZEGERID) 40-1100 MG per capsule Take 1 capsule by mouth daily before breakfast.      . pentosan polysulfate (ELMIRON) 100 MG capsule Take 100 mg by mouth 3 (three) times daily before meals.       No current facility-administered medications for this visit.    Review of Systems Review of Systems  Constitutional: Negative.   HENT: Negative.   Eyes: Negative.   Respiratory: Negative.   Cardiovascular: Negative.   Gastrointestinal: Negative for anal bleeding and rectal pain.  Endocrine: Negative.   Genitourinary: Negative.    Musculoskeletal: Negative.   Skin: Negative.   Allergic/Immunologic: Negative.   Neurological: Negative.   Hematological: Negative.   Psychiatric/Behavioral: Negative.     Blood pressure 120/78, pulse 68, temperature 97.7 F (36.5 C), temperature source Temporal, resp. rate 14, height 6\' 1"  (1.854 m), weight 194 lb 12.8 oz (88.361 kg).  Physical Exam Physical Exam  Constitutional: He is oriented to person, place, and time. He appears well-developed and well-nourished.  HENT:  Head: Normocephalic and atraumatic.  Eyes: Conjunctivae and EOM are normal. Pupils are equal, round, and reactive to light.  Neck: Normal range of motion. Neck supple.  Cardiovascular: Normal rate, regular rhythm and normal heart sounds.   Pulmonary/Chest: Effort normal and breath sounds normal.  Abdominal: Soft. Bowel sounds are normal.  Genitourinary:  In the right lateral perirectal area there is a tiny(2-51mm) clot. There is no associated swelling or inflammation  Musculoskeletal: Normal range of motion.  Neurological: He is alert and oriented to person, place, and time.  Skin: Skin is warm and dry.  Psychiatric: He has a normal mood and affect. His behavior is normal.    Data Reviewed As above  Assessment    The pt has a very small thrombosis of an external hemorrhoid with no bleeding or pain  Plan    I have recommended some local wound care measures and I think this will resolve on its own without having to be lanced. He can follow up on a prn basis        TOTH III,PAUL S 12/28/2012, 3:07 AM

## 2012-12-28 NOTE — Progress Notes (Signed)
Patient returns to the office for a second opinion small thrombosed external hemorrhoid after being seen yesterday in the urgent office. This was seen and felt to be too small for excision. The patient has minimal if any discomfort. He wished a second opinion about excision.  Exam: The patient's right lateral position is a 3 mm external hemorrhoid with clot. Mild tenderness to palpation.  Impression: 3 mm thrombosed external hemorrhoid right lateral position.  Plan: I offered excision after lengthy discussion about his condition. Long term expectations discussed as well. After discussion of operative and nonoperative options, he would like to observe and will return if becomes larger or more uncomfortable for excision.

## 2013-06-04 ENCOUNTER — Encounter (HOSPITAL_BASED_OUTPATIENT_CLINIC_OR_DEPARTMENT_OTHER): Payer: Self-pay | Admitting: Emergency Medicine

## 2013-06-04 ENCOUNTER — Emergency Department (HOSPITAL_BASED_OUTPATIENT_CLINIC_OR_DEPARTMENT_OTHER)
Admission: EM | Admit: 2013-06-04 | Discharge: 2013-06-05 | Disposition: A | Discharge status: Home / Self Care | Payer: BC Managed Care – PPO | Attending: Emergency Medicine | Admitting: Emergency Medicine

## 2013-06-04 ENCOUNTER — Emergency Department (HOSPITAL_BASED_OUTPATIENT_CLINIC_OR_DEPARTMENT_OTHER): Payer: BC Managed Care – PPO

## 2013-06-04 DIAGNOSIS — Y9389 Activity, other specified: Secondary | ICD-10-CM | POA: Insufficient documentation

## 2013-06-04 DIAGNOSIS — Z87891 Personal history of nicotine dependence: Secondary | ICD-10-CM | POA: Insufficient documentation

## 2013-06-04 DIAGNOSIS — R110 Nausea: Secondary | ICD-10-CM | POA: Insufficient documentation

## 2013-06-04 DIAGNOSIS — S060X0A Concussion without loss of consciousness, initial encounter: Secondary | ICD-10-CM | POA: Insufficient documentation

## 2013-06-04 DIAGNOSIS — K219 Gastro-esophageal reflux disease without esophagitis: Secondary | ICD-10-CM | POA: Insufficient documentation

## 2013-06-04 DIAGNOSIS — W1809XA Striking against other object with subsequent fall, initial encounter: Secondary | ICD-10-CM | POA: Insufficient documentation

## 2013-06-04 DIAGNOSIS — G479 Sleep disorder, unspecified: Secondary | ICD-10-CM | POA: Insufficient documentation

## 2013-06-04 DIAGNOSIS — W010XXA Fall on same level from slipping, tripping and stumbling without subsequent striking against object, initial encounter: Secondary | ICD-10-CM | POA: Insufficient documentation

## 2013-06-04 DIAGNOSIS — Z79899 Other long term (current) drug therapy: Secondary | ICD-10-CM | POA: Insufficient documentation

## 2013-06-04 DIAGNOSIS — S060X9A Concussion with loss of consciousness of unspecified duration, initial encounter: Secondary | ICD-10-CM

## 2013-06-04 DIAGNOSIS — S060XAA Concussion with loss of consciousness status unknown, initial encounter: Secondary | ICD-10-CM

## 2013-06-04 DIAGNOSIS — Y929 Unspecified place or not applicable: Secondary | ICD-10-CM | POA: Insufficient documentation

## 2013-06-04 LAB — RAPID URINE DRUG SCREEN, HOSP PERFORMED
AMPHETAMINES: NOT DETECTED
Barbiturates: NOT DETECTED
Benzodiazepines: NOT DETECTED
COCAINE: NOT DETECTED
OPIATES: NOT DETECTED
TETRAHYDROCANNABINOL: NOT DETECTED

## 2013-06-04 LAB — BASIC METABOLIC PANEL
BUN: 11 mg/dL (ref 6–23)
CHLORIDE: 105 meq/L (ref 96–112)
CO2: 26 meq/L (ref 19–32)
Calcium: 9.4 mg/dL (ref 8.4–10.5)
Creatinine, Ser: 1.1 mg/dL (ref 0.50–1.35)
GFR calc Af Amer: >90 mL/min (ref >90)
GFR calc non Af Amer: 79 mL/min — ABNORMAL LOW (ref >90)
Glucose, Bld: 101 mg/dL — ABNORMAL HIGH (ref 70–99)
POTASSIUM: 4.4 meq/L (ref 3.7–5.3)
SODIUM: 145 meq/L (ref 137–147)

## 2013-06-04 LAB — ETHANOL: ALCOHOL ETHYL (B): 100 mg/dL — AB (ref 0–11)

## 2013-06-04 NOTE — ED Notes (Signed)
Patient transported to CT 

## 2013-06-04 NOTE — ED Notes (Signed)
Pt texted his wife this evening stating that he had tripped and fell into the baby's crib, a slat was broken, and he was repairing it.  Wife came home to find three doors (one door that wasn't locked had the casing broken) busted in, a wrought iron rail in a dog gate bent.  Patient has no recollection of breaking the doors......the damage done in the home is not matching the patient's memory of events.  Wife reports the child was unharmed in this incident.

## 2013-06-04 NOTE — ED Provider Notes (Signed)
CSN: [id number]     Arrival date & time 06/04/13  2126 History   First MD Initiated Contact with Patient 06/04/13 2147     Chief Complaint  Patient presents with  . Fall   (Consider location/radiation/quality/duration/timing/severity/associated sxs/prior Treatment) The history is provided by the patient and the spouse. No language interpreter was used.    History was obtained from patient and wife at bedside.  Pt is a 48 yo male here with wife with c/o falling and hitting his head. He does not remember what happened and history if predominantly provided by his wife based on communication via text with patient during event and on inspection of the home after event.  Pt and wife state he was watching the game with his friend and had approx 4-5 beers, when he usually only drinks on rare occasion.  Pts wife states that she and others left the house at 6:30, leaving the pt alone with the 16 mo child.  Wife states she got a text from the pt stating he "had a meltdown", with a second text saying the pt fell and hit his head on the baby's crib, but the baby was fine.  Upon arriving home, pts wife found damage to multiple doors, the slat on the baby's crib broken, a hole in the wall near the crib, and the dogs indoor iron gate being bent.  The pt has no recollection of doing any of this, but has a vague memory of hitting his head in the baby's room.  Pt and wife state that he has no psychiatric history, but has been suffering from lack of sleep recently.  Pt is unsure if he had LOC.  He denies blurred vision, neck pain, hearing changes, dizziness, recent stressors, or drug use.   Past Medical History  Diagnosis Date  . GERD (gastroesophageal reflux disease)    Past Surgical History  Procedure Laterality Date  . Cholecystectomy    . Hernia repair      *2 in same spot  . Nissen fundoplication     Family History  Problem Relation Age of Onset  . Cancer Father     thyroid and prostate   History   Substance Use Topics  . Smoking status: Former Smoker    Quit date: 05/05/1997  . Smokeless tobacco: Never Used  . Alcohol Use: Yes     Comment: occasionall    Review of Systems  Constitutional: Negative for fever, chills and diaphoresis.  HENT: Negative for hearing loss and nosebleeds.   Eyes: Negative for visual disturbance.  Respiratory: Negative for shortness of breath.   Cardiovascular: Negative for chest pain.  Gastrointestinal: Positive for nausea. Negative for vomiting, abdominal pain and diarrhea.  Genitourinary: Negative.   Musculoskeletal: Negative for back pain, neck pain and neck stiffness.  Neurological: Negative for facial asymmetry, speech difficulty, weakness, numbness and headaches.  Psychiatric/Behavioral: Positive for sleep disturbance.       Pt reports a lack of sleep x 1 week.    Allergies  Review of patient's allergies indicates no known allergies.  Home Medications   Current Outpatient Rx  Name  Route  Sig  Dispense  Refill  . buPROPion (WELLBUTRIN) 100 MG tablet   Oral   Take 100 mg by mouth 2 (two) times daily.         Marland Kitchen. omeprazole-sodium bicarbonate (ZEGERID) 40-1100 MG per capsule   Oral   Take 1 capsule by mouth daily before breakfast.         .  pentosan polysulfate (ELMIRON) 100 MG capsule   Oral   Take 100 mg by mouth 3 (three) times daily before meals.          BP 131/93  Pulse 103  Temp(Src) 98.3 F (36.8 C) (Oral)  Resp 18  Ht 6' (1.829 m)  Wt 190 lb (86.183 kg)  BMI 25.76 kg/m2  SpO2 100% Physical Exam  Constitutional: He is oriented to person, place, and time. He appears well-developed and well-nourished. No distress.  HENT:  Head: Normocephalic.  Tenderness to R temporal region, but area is indistinguishable from surroundings. No obvious deformities or signs of injury.  Eyes: Conjunctivae and EOM are normal. Pupils are equal, round, and reactive to light. Right eye exhibits no discharge. Left eye exhibits no  discharge.  Negative for disconjugate gaze  Neck: Normal range of motion. Neck supple.  Cardiovascular: Normal rate.  Exam reveals no gallop and no friction rub.   No murmur heard. Pulmonary/Chest: Effort normal and breath sounds normal. No respiratory distress. He has no wheezes.  Abdominal: Soft. Bowel sounds are normal. There is no tenderness.  Musculoskeletal: Normal range of motion. He exhibits no tenderness.  Neurological: He is alert and oriented to person, place, and time. He has normal strength. No cranial nerve deficit or sensory deficit. Coordination normal.  Skin: Skin is warm and dry. He is not diaphoretic.  Psychiatric: He has a normal mood and affect. His speech is normal. He exhibits abnormal recent memory.  Pt seems to have trouble choosing words at time.  Wife concurs with this assessment.  Folstein MMSE was 28/30.    ED Course  Procedures (including critical care time) Labs Review Labs Reviewed  ETHANOL  BASIC METABOLIC PANEL  URINE RAPID DRUG SCREEN (HOSP PERFORMED)   Results for orders placed during the hospital encounter of 06/04/13  ETHANOL      Result Value Range   Alcohol, Ethyl (B) 100 (*) 0 - 11 mg/dL  BASIC METABOLIC PANEL      Result Value Range   Sodium 145  137 - 147 mEq/L   Potassium 4.4  3.7 - 5.3 mEq/L   Chloride 105  96 - 112 mEq/L   CO2 26  19 - 32 mEq/L   Glucose, Bld 101 (*) 70 - 99 mg/dL   BUN 11  6 - 23 mg/dL   Creatinine, Ser 1.61  0.50 - 1.35 mg/dL   Calcium 9.4  8.4 - 09.6 mg/dL   GFR calc non Af Amer 79 (*) >90 mL/min   GFR calc Af Amer >90  >90 mL/min  URINE RAPID DRUG SCREEN (HOSP PERFORMED)      Result Value Range   Opiates NONE DETECTED  NONE DETECTED   Cocaine NONE DETECTED  NONE DETECTED   Benzodiazepines NONE DETECTED  NONE DETECTED   Amphetamines NONE DETECTED  NONE DETECTED   Tetrahydrocannabinol NONE DETECTED  NONE DETECTED   Barbiturates NONE DETECTED  NONE DETECTED    Imaging Review Ct Head Wo  Contrast  06/04/2013   CLINICAL DATA:  Tripped and fell hit head confusion and memory loss  EXAM: CT HEAD WITHOUT CONTRAST  TECHNIQUE: Contiguous axial images were obtained from the base of the skull through the vertex without intravenous contrast.  COMPARISON:  None.  FINDINGS: No hemorrhage, infarct, or extra-axial fluid. No mass or hydrocephalus. Normal sulcation and attenuation. There is significant diffuse bilateral anterior and posterior ethmoid air cell opacification. There is mild inflammatory change in the sphenoid sinuses.  IMPRESSION: Sinusitis.  No acute intracranial abnormalities.   Electronically Signed   By: Esperanza Heir M.D.   On: 06/04/2013 22:04    EKG Interpretation   None       MDM  No diagnosis found. 1. Concussion  He is alert and oriented in the ED and remains so on multiple re-evaluations. His memory loss continues. Neg head CT. Discussed diagnosis of concussion with patient and wife, and feel he is stable for discharge. Discussed with Dr. [place] Sheldon who agrees.     Arnoldo Hooker, PA-C 06/05/13 1521

## 2013-06-04 NOTE — Discharge Instructions (Signed)

## 2013-06-05 NOTE — ED Notes (Signed)
No rx given- Pt cao x 4 at d/c. Ambulates with steady gait. Work note given. D/c home with ride

## 2013-06-05 NOTE — ED Provider Notes (Signed)
Medical screening examination/treatment/procedure(s) were performed by non-physician practitioner and as supervising physician I was immediately available for consultation/collaboration.  EKG Interpretation   None         Charles B. Bernette MayersSheldon, MD 06/05/13 [phone]

## 2015-11-12 DIAGNOSIS — F3181 Bipolar II disorder: Secondary | ICD-10-CM | POA: Diagnosis not present

## 2015-11-21 DIAGNOSIS — N419 Inflammatory disease of prostate, unspecified: Secondary | ICD-10-CM | POA: Diagnosis not present

## 2015-12-03 DIAGNOSIS — Z6827 Body mass index (BMI) 27.0-27.9, adult: Secondary | ICD-10-CM | POA: Diagnosis not present

## 2015-12-03 DIAGNOSIS — N419 Inflammatory disease of prostate, unspecified: Secondary | ICD-10-CM | POA: Diagnosis not present

## 2016-01-02 DIAGNOSIS — J028 Acute pharyngitis due to other specified organisms: Secondary | ICD-10-CM | POA: Diagnosis not present

## 2016-01-02 DIAGNOSIS — R509 Fever, unspecified: Secondary | ICD-10-CM | POA: Diagnosis not present

## 2016-01-02 DIAGNOSIS — R0602 Shortness of breath: Secondary | ICD-10-CM | POA: Diagnosis not present

## 2016-01-02 DIAGNOSIS — J018 Other acute sinusitis: Secondary | ICD-10-CM | POA: Diagnosis not present

## 2016-01-02 DIAGNOSIS — B9789 Other viral agents as the cause of diseases classified elsewhere: Secondary | ICD-10-CM | POA: Diagnosis not present

## 2016-01-15 DIAGNOSIS — B9689 Other specified bacterial agents as the cause of diseases classified elsewhere: Secondary | ICD-10-CM | POA: Diagnosis not present

## 2016-01-15 DIAGNOSIS — J069 Acute upper respiratory infection, unspecified: Secondary | ICD-10-CM | POA: Diagnosis not present

## 2016-02-01 DIAGNOSIS — R509 Fever, unspecified: Secondary | ICD-10-CM | POA: Diagnosis not present

## 2016-02-01 DIAGNOSIS — Z79899 Other long term (current) drug therapy: Secondary | ICD-10-CM | POA: Diagnosis not present

## 2016-02-01 DIAGNOSIS — J029 Acute pharyngitis, unspecified: Secondary | ICD-10-CM | POA: Diagnosis not present

## 2016-02-04 DIAGNOSIS — F3181 Bipolar II disorder: Secondary | ICD-10-CM | POA: Diagnosis not present

## 2016-03-05 DIAGNOSIS — R300 Dysuria: Secondary | ICD-10-CM | POA: Diagnosis not present

## 2016-03-09 DIAGNOSIS — R5383 Other fatigue: Secondary | ICD-10-CM | POA: Diagnosis not present

## 2016-03-09 DIAGNOSIS — E559 Vitamin D deficiency, unspecified: Secondary | ICD-10-CM | POA: Diagnosis not present

## 2016-03-09 DIAGNOSIS — Z1389 Encounter for screening for other disorder: Secondary | ICD-10-CM | POA: Diagnosis not present

## 2016-03-09 DIAGNOSIS — Z0000 Encounter for general adult medical examination without abnormal findings: Secondary | ICD-10-CM | POA: Diagnosis not present

## 2016-03-09 DIAGNOSIS — Z114 Encounter for screening for human immunodeficiency virus [HIV]: Secondary | ICD-10-CM | POA: Diagnosis not present

## 2016-03-11 DIAGNOSIS — N200 Calculus of kidney: Secondary | ICD-10-CM | POA: Diagnosis not present

## 2016-03-11 DIAGNOSIS — R351 Nocturia: Secondary | ICD-10-CM | POA: Diagnosis not present

## 2016-03-11 DIAGNOSIS — R3129 Other microscopic hematuria: Secondary | ICD-10-CM | POA: Diagnosis not present

## 2016-03-11 DIAGNOSIS — R350 Frequency of micturition: Secondary | ICD-10-CM | POA: Diagnosis not present

## 2016-03-11 DIAGNOSIS — R399 Unspecified symptoms and signs involving the genitourinary system: Secondary | ICD-10-CM | POA: Diagnosis not present

## 2016-03-11 DIAGNOSIS — N411 Chronic prostatitis: Secondary | ICD-10-CM | POA: Diagnosis not present

## 2016-03-11 DIAGNOSIS — Z6826 Body mass index (BMI) 26.0-26.9, adult: Secondary | ICD-10-CM | POA: Diagnosis not present

## 2016-03-11 DIAGNOSIS — N410 Acute prostatitis: Secondary | ICD-10-CM | POA: Diagnosis not present

## 2016-03-11 DIAGNOSIS — R945 Abnormal results of liver function studies: Secondary | ICD-10-CM | POA: Diagnosis not present

## 2016-03-20 DIAGNOSIS — E782 Mixed hyperlipidemia: Secondary | ICD-10-CM | POA: Diagnosis not present

## 2016-03-20 DIAGNOSIS — R748 Abnormal levels of other serum enzymes: Secondary | ICD-10-CM | POA: Diagnosis not present

## 2016-03-20 DIAGNOSIS — R310 Gross hematuria: Secondary | ICD-10-CM | POA: Diagnosis not present

## 2016-03-20 DIAGNOSIS — E559 Vitamin D deficiency, unspecified: Secondary | ICD-10-CM | POA: Diagnosis not present

## 2016-04-25 DIAGNOSIS — N411 Chronic prostatitis: Secondary | ICD-10-CM | POA: Diagnosis not present

## 2016-04-25 DIAGNOSIS — R351 Nocturia: Secondary | ICD-10-CM | POA: Diagnosis not present

## 2016-04-25 DIAGNOSIS — R350 Frequency of micturition: Secondary | ICD-10-CM | POA: Diagnosis not present

## 2016-05-12 DIAGNOSIS — F3181 Bipolar II disorder: Secondary | ICD-10-CM | POA: Diagnosis not present

## 2016-06-19 DIAGNOSIS — J209 Acute bronchitis, unspecified: Secondary | ICD-10-CM | POA: Diagnosis not present

## 2016-06-19 DIAGNOSIS — R0602 Shortness of breath: Secondary | ICD-10-CM | POA: Diagnosis not present

## 2016-08-11 DIAGNOSIS — F3181 Bipolar II disorder: Secondary | ICD-10-CM | POA: Diagnosis not present

## 2016-09-12 DIAGNOSIS — Z79899 Other long term (current) drug therapy: Secondary | ICD-10-CM | POA: Diagnosis not present

## 2016-09-12 DIAGNOSIS — E782 Mixed hyperlipidemia: Secondary | ICD-10-CM | POA: Diagnosis not present

## 2016-09-12 DIAGNOSIS — Z8042 Family history of malignant neoplasm of prostate: Secondary | ICD-10-CM | POA: Diagnosis not present

## 2016-09-16 DIAGNOSIS — N529 Male erectile dysfunction, unspecified: Secondary | ICD-10-CM | POA: Diagnosis not present

## 2016-09-16 DIAGNOSIS — N411 Chronic prostatitis: Secondary | ICD-10-CM | POA: Diagnosis not present

## 2016-09-16 DIAGNOSIS — R21 Rash and other nonspecific skin eruption: Secondary | ICD-10-CM | POA: Diagnosis not present

## 2016-09-16 DIAGNOSIS — L719 Rosacea, unspecified: Secondary | ICD-10-CM | POA: Diagnosis not present

## 2016-11-10 DIAGNOSIS — F3181 Bipolar II disorder: Secondary | ICD-10-CM | POA: Diagnosis not present

## 2017-01-31 DIAGNOSIS — R350 Frequency of micturition: Secondary | ICD-10-CM | POA: Diagnosis not present

## 2017-01-31 DIAGNOSIS — R1084 Generalized abdominal pain: Secondary | ICD-10-CM | POA: Diagnosis not present

## 2017-03-02 DIAGNOSIS — J029 Acute pharyngitis, unspecified: Secondary | ICD-10-CM | POA: Diagnosis not present

## 2017-03-02 DIAGNOSIS — R05 Cough: Secondary | ICD-10-CM | POA: Diagnosis not present

## 2017-03-16 DIAGNOSIS — R05 Cough: Secondary | ICD-10-CM | POA: Diagnosis not present

## 2017-03-16 DIAGNOSIS — J018 Other acute sinusitis: Secondary | ICD-10-CM | POA: Diagnosis not present

## 2017-03-16 DIAGNOSIS — R0602 Shortness of breath: Secondary | ICD-10-CM | POA: Diagnosis not present

## 2017-04-25 DIAGNOSIS — J029 Acute pharyngitis, unspecified: Secondary | ICD-10-CM | POA: Diagnosis not present

## 2017-04-25 DIAGNOSIS — J019 Acute sinusitis, unspecified: Secondary | ICD-10-CM | POA: Diagnosis not present

## 2017-05-07 DIAGNOSIS — R9389 Abnormal findings on diagnostic imaging of other specified body structures: Secondary | ICD-10-CM | POA: Diagnosis not present

## 2017-05-11 DIAGNOSIS — F3181 Bipolar II disorder: Secondary | ICD-10-CM | POA: Diagnosis not present

## 2017-06-10 DIAGNOSIS — L309 Dermatitis, unspecified: Secondary | ICD-10-CM | POA: Diagnosis not present

## 2017-06-10 DIAGNOSIS — L218 Other seborrheic dermatitis: Secondary | ICD-10-CM | POA: Diagnosis not present

## 2017-06-10 DIAGNOSIS — L738 Other specified follicular disorders: Secondary | ICD-10-CM | POA: Diagnosis not present

## 2017-06-12 DIAGNOSIS — J329 Chronic sinusitis, unspecified: Secondary | ICD-10-CM | POA: Diagnosis not present

## 2017-06-12 DIAGNOSIS — B9689 Other specified bacterial agents as the cause of diseases classified elsewhere: Secondary | ICD-10-CM | POA: Diagnosis not present

## 2017-06-17 DIAGNOSIS — H6122 Impacted cerumen, left ear: Secondary | ICD-10-CM | POA: Diagnosis not present

## 2017-06-17 DIAGNOSIS — J322 Chronic ethmoidal sinusitis: Secondary | ICD-10-CM | POA: Diagnosis not present

## 2017-06-17 DIAGNOSIS — J342 Deviated nasal septum: Secondary | ICD-10-CM | POA: Diagnosis not present

## 2017-06-17 DIAGNOSIS — J320 Chronic maxillary sinusitis: Secondary | ICD-10-CM | POA: Diagnosis not present

## 2017-06-27 DIAGNOSIS — H6691 Otitis media, unspecified, right ear: Secondary | ICD-10-CM | POA: Diagnosis not present

## 2017-06-27 DIAGNOSIS — H6690 Otitis media, unspecified, unspecified ear: Secondary | ICD-10-CM | POA: Diagnosis not present

## 2017-07-16 ENCOUNTER — Encounter (HOSPITAL_BASED_OUTPATIENT_CLINIC_OR_DEPARTMENT_OTHER): Payer: Self-pay

## 2017-07-16 ENCOUNTER — Emergency Department (HOSPITAL_BASED_OUTPATIENT_CLINIC_OR_DEPARTMENT_OTHER): Payer: BLUE CROSS/BLUE SHIELD

## 2017-07-16 ENCOUNTER — Emergency Department (HOSPITAL_BASED_OUTPATIENT_CLINIC_OR_DEPARTMENT_OTHER)
Admission: EM | Admit: 2017-07-16 | Discharge: 2017-07-16 | Disposition: A | Discharge status: Home / Self Care | Payer: BLUE CROSS/BLUE SHIELD | Attending: Emergency Medicine | Admitting: Emergency Medicine

## 2017-07-16 ENCOUNTER — Other Ambulatory Visit: Payer: Self-pay

## 2017-07-16 DIAGNOSIS — Y999 Unspecified external cause status: Secondary | ICD-10-CM | POA: Insufficient documentation

## 2017-07-16 DIAGNOSIS — Z79899 Other long term (current) drug therapy: Secondary | ICD-10-CM | POA: Diagnosis not present

## 2017-07-16 DIAGNOSIS — S161XXA Strain of muscle, fascia and tendon at neck level, initial encounter: Secondary | ICD-10-CM | POA: Diagnosis not present

## 2017-07-16 DIAGNOSIS — S199XXA Unspecified injury of neck, initial encounter: Secondary | ICD-10-CM | POA: Diagnosis not present

## 2017-07-16 DIAGNOSIS — Y9389 Activity, other specified: Secondary | ICD-10-CM | POA: Insufficient documentation

## 2017-07-16 DIAGNOSIS — S60222A Contusion of left hand, initial encounter: Secondary | ICD-10-CM

## 2017-07-16 DIAGNOSIS — Z87891 Personal history of nicotine dependence: Secondary | ICD-10-CM | POA: Insufficient documentation

## 2017-07-16 DIAGNOSIS — M542 Cervicalgia: Secondary | ICD-10-CM | POA: Diagnosis not present

## 2017-07-16 DIAGNOSIS — Y92410 Unspecified street and highway as the place of occurrence of the external cause: Secondary | ICD-10-CM | POA: Insufficient documentation

## 2017-07-16 DIAGNOSIS — S6992XA Unspecified injury of left wrist, hand and finger(s), initial encounter: Secondary | ICD-10-CM | POA: Diagnosis not present

## 2017-07-16 DIAGNOSIS — M79642 Pain in left hand: Secondary | ICD-10-CM | POA: Diagnosis not present

## 2017-07-16 MED ORDER — CYCLOBENZAPRINE HCL 10 MG PO TABS: 10.0000 mg | ORAL_TABLET | Freq: Two times a day (BID) | ORAL | 0 refills | Status: DC | PRN

## 2017-07-16 MED ORDER — NAPROXEN 500 MG PO TABS: 500.0000 mg | ORAL_TABLET | Freq: Two times a day (BID) | ORAL | 0 refills | Status: DC
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
## 2017-07-16 NOTE — Discharge Instructions (Signed)
The x-ray of your left hand today was negative. Please read attached information regarding your condition. Take naproxen and Flexeril as needed for your soreness. Return to ED for worsening symptoms, trouble walking, severe chest pain or abdominal pain, head injuries, vision changes.

## 2017-07-21 DIAGNOSIS — M9901 Segmental and somatic dysfunction of cervical region: Secondary | ICD-10-CM | POA: Diagnosis not present

## 2017-07-21 DIAGNOSIS — M9903 Segmental and somatic dysfunction of lumbar region: Secondary | ICD-10-CM | POA: Diagnosis not present

## 2017-07-21 DIAGNOSIS — M531 Cervicobrachial syndrome: Secondary | ICD-10-CM | POA: Diagnosis not present

## 2017-07-21 DIAGNOSIS — M9902 Segmental and somatic dysfunction of thoracic region: Secondary | ICD-10-CM | POA: Diagnosis not present

## 2017-07-24 DIAGNOSIS — M9901 Segmental and somatic dysfunction of cervical region: Secondary | ICD-10-CM | POA: Diagnosis not present

## 2017-07-24 DIAGNOSIS — M9903 Segmental and somatic dysfunction of lumbar region: Secondary | ICD-10-CM | POA: Diagnosis not present

## 2017-07-24 DIAGNOSIS — M531 Cervicobrachial syndrome: Secondary | ICD-10-CM | POA: Diagnosis not present

## 2017-07-24 DIAGNOSIS — M9902 Segmental and somatic dysfunction of thoracic region: Secondary | ICD-10-CM | POA: Diagnosis not present

## 2017-07-25 DIAGNOSIS — M549 Dorsalgia, unspecified: Secondary | ICD-10-CM | POA: Diagnosis not present

## 2017-07-27 DIAGNOSIS — M9901 Segmental and somatic dysfunction of cervical region: Secondary | ICD-10-CM | POA: Diagnosis not present

## 2017-07-27 DIAGNOSIS — M9902 Segmental and somatic dysfunction of thoracic region: Secondary | ICD-10-CM | POA: Diagnosis not present

## 2017-07-27 DIAGNOSIS — M9903 Segmental and somatic dysfunction of lumbar region: Secondary | ICD-10-CM | POA: Diagnosis not present

## 2017-07-27 DIAGNOSIS — M531 Cervicobrachial syndrome: Secondary | ICD-10-CM | POA: Diagnosis not present

## 2017-07-29 DIAGNOSIS — M9902 Segmental and somatic dysfunction of thoracic region: Secondary | ICD-10-CM | POA: Diagnosis not present

## 2017-07-29 DIAGNOSIS — M9903 Segmental and somatic dysfunction of lumbar region: Secondary | ICD-10-CM | POA: Diagnosis not present

## 2017-07-29 DIAGNOSIS — M9901 Segmental and somatic dysfunction of cervical region: Secondary | ICD-10-CM | POA: Diagnosis not present

## 2017-07-29 DIAGNOSIS — M531 Cervicobrachial syndrome: Secondary | ICD-10-CM | POA: Diagnosis not present

## 2017-07-31 DIAGNOSIS — M9901 Segmental and somatic dysfunction of cervical region: Secondary | ICD-10-CM | POA: Diagnosis not present

## 2017-07-31 DIAGNOSIS — M9903 Segmental and somatic dysfunction of lumbar region: Secondary | ICD-10-CM | POA: Diagnosis not present

## 2017-07-31 DIAGNOSIS — M9902 Segmental and somatic dysfunction of thoracic region: Secondary | ICD-10-CM | POA: Diagnosis not present

## 2017-07-31 DIAGNOSIS — M531 Cervicobrachial syndrome: Secondary | ICD-10-CM | POA: Diagnosis not present

## 2017-08-03 DIAGNOSIS — M531 Cervicobrachial syndrome: Secondary | ICD-10-CM | POA: Diagnosis not present

## 2017-08-03 DIAGNOSIS — M9901 Segmental and somatic dysfunction of cervical region: Secondary | ICD-10-CM | POA: Diagnosis not present

## 2017-08-03 DIAGNOSIS — M9903 Segmental and somatic dysfunction of lumbar region: Secondary | ICD-10-CM | POA: Diagnosis not present

## 2017-08-03 DIAGNOSIS — M9902 Segmental and somatic dysfunction of thoracic region: Secondary | ICD-10-CM | POA: Diagnosis not present

## 2017-08-04 DIAGNOSIS — M549 Dorsalgia, unspecified: Secondary | ICD-10-CM | POA: Diagnosis not present

## 2017-08-04 DIAGNOSIS — S39012A Strain of muscle, fascia and tendon of lower back, initial encounter: Secondary | ICD-10-CM | POA: Diagnosis not present

## 2017-08-05 DIAGNOSIS — M9903 Segmental and somatic dysfunction of lumbar region: Secondary | ICD-10-CM | POA: Diagnosis not present

## 2017-08-05 DIAGNOSIS — M9901 Segmental and somatic dysfunction of cervical region: Secondary | ICD-10-CM | POA: Diagnosis not present

## 2017-08-05 DIAGNOSIS — M9902 Segmental and somatic dysfunction of thoracic region: Secondary | ICD-10-CM | POA: Diagnosis not present

## 2017-08-05 DIAGNOSIS — M531 Cervicobrachial syndrome: Secondary | ICD-10-CM | POA: Diagnosis not present

## 2017-08-07 DIAGNOSIS — N410 Acute prostatitis: Secondary | ICD-10-CM | POA: Diagnosis not present

## 2017-08-07 DIAGNOSIS — R399 Unspecified symptoms and signs involving the genitourinary system: Secondary | ICD-10-CM | POA: Diagnosis not present

## 2017-08-10 DIAGNOSIS — M531 Cervicobrachial syndrome: Secondary | ICD-10-CM | POA: Diagnosis not present

## 2017-08-10 DIAGNOSIS — M9902 Segmental and somatic dysfunction of thoracic region: Secondary | ICD-10-CM | POA: Diagnosis not present

## 2017-08-10 DIAGNOSIS — M9901 Segmental and somatic dysfunction of cervical region: Secondary | ICD-10-CM | POA: Diagnosis not present

## 2017-08-10 DIAGNOSIS — M9903 Segmental and somatic dysfunction of lumbar region: Secondary | ICD-10-CM | POA: Diagnosis not present

## 2017-08-12 DIAGNOSIS — M9902 Segmental and somatic dysfunction of thoracic region: Secondary | ICD-10-CM | POA: Diagnosis not present

## 2017-08-12 DIAGNOSIS — M531 Cervicobrachial syndrome: Secondary | ICD-10-CM | POA: Diagnosis not present

## 2017-08-12 DIAGNOSIS — M9901 Segmental and somatic dysfunction of cervical region: Secondary | ICD-10-CM | POA: Diagnosis not present

## 2017-08-12 DIAGNOSIS — M9903 Segmental and somatic dysfunction of lumbar region: Secondary | ICD-10-CM | POA: Diagnosis not present

## 2017-08-14 ENCOUNTER — Other Ambulatory Visit: Payer: Self-pay

## 2017-08-14 ENCOUNTER — Encounter (HOSPITAL_BASED_OUTPATIENT_CLINIC_OR_DEPARTMENT_OTHER): Payer: Self-pay

## 2017-08-14 ENCOUNTER — Emergency Department (HOSPITAL_BASED_OUTPATIENT_CLINIC_OR_DEPARTMENT_OTHER)
Admission: EM | Admit: 2017-08-14 | Discharge: 2017-08-14 | Disposition: A | Discharge status: Home / Self Care | Payer: BLUE CROSS/BLUE SHIELD | Attending: Emergency Medicine | Admitting: Emergency Medicine

## 2017-08-14 DIAGNOSIS — M9901 Segmental and somatic dysfunction of cervical region: Secondary | ICD-10-CM | POA: Diagnosis not present

## 2017-08-14 DIAGNOSIS — T39391A Poisoning by other nonsteroidal anti-inflammatory drugs [NSAID], accidental (unintentional), initial encounter: Secondary | ICD-10-CM | POA: Insufficient documentation

## 2017-08-14 DIAGNOSIS — N411 Chronic prostatitis: Secondary | ICD-10-CM | POA: Diagnosis not present

## 2017-08-14 DIAGNOSIS — M9902 Segmental and somatic dysfunction of thoracic region: Secondary | ICD-10-CM | POA: Diagnosis not present

## 2017-08-14 DIAGNOSIS — M531 Cervicobrachial syndrome: Secondary | ICD-10-CM | POA: Diagnosis not present

## 2017-08-14 DIAGNOSIS — M9903 Segmental and somatic dysfunction of lumbar region: Secondary | ICD-10-CM | POA: Diagnosis not present

## 2017-08-14 MED ORDER — DIPHENHYDRAMINE HCL 25 MG PO CAPS: 25.0000 mg | ORAL_CAPSULE | Freq: Once | ORAL | Status: DC

## 2017-08-14 NOTE — ED Provider Notes (Signed)
[facility] EMERGENCY DEPARTMENT Provider Note   CSN: [id number] Arrival date & time: 08/14/17  2016     History   Chief Complaint Chief Complaint  Patient presents with  . Medication Problem    HPI [name] is a 52 y.o. male who presents the emergency department for overdose of NSAIDs.  Patient states he took 800 mg of ibuprofen and went to see his physician for pain secondary to recent diagnosis of prostatitis.  Patient states that he took a 15 mg Mobic and then realized that he had taken too much of the same class of drugs.  He started reading about overdose symptoms online.  Is complaining of bilateral upper extremity achy pain that has resolved.  He feels like there is some itching on his skin without hives, nausea, vomiting, abdominal pain, diarrhea, shortness of breath, wheezing, vomiting or syncope.  The patient also thought he might have some ringing in his ears.  He has none currently.  He has no other complaints at this time  HPI  Past Medical History:  Diagnosis Date  . GERD (gastroesophageal reflux disease)     Patient Active Problem List   Diagnosis Date Noted  . Thrombosed external hemorrhoid 12/27/2012    Past Surgical History:  Procedure Laterality Date  . CHOLECYSTECTOMY    . HERNIA REPAIR     *2 in same spot  . NISSEN FUNDOPLICATION          Home Medications    Prior to Admission medications   Medication Sig Start Date End Date Taking? Authorizing Provider  buPROPion (WELLBUTRIN) 100 MG tablet Take 100 mg by mouth 2 (two) times daily.    [provider]  cyclobenzaprine (FLEXERIL) 10 MG tablet Take 1 tablet (10 mg total) by mouth 2 (two) times daily as needed for muscle spasms. 07/16/17   Khatri, Hina, PA-C  LamoTRIgine (LAMICTAL PO) Take by mouth.    [provider]  naproxen (NAPROSYN) 500 MG tablet Take 1 tablet (500 mg total) by mouth 2 (two) times daily. 07/16/17   Khatri, Hina, PA-C  omeprazole-sodium  bicarbonate (ZEGERID) 40-1100 MG per capsule Take 1 capsule by mouth daily before breakfast.    [provider]  OXcarbazepine (TRILEPTAL PO) Take by mouth.    [provider]  pentosan polysulfate (ELMIRON) 100 MG capsule Take 100 mg by mouth 3 (three) times daily before meals.    [provider]    Family History Family History  Problem Relation Age of Onset  . Cancer Father        thyroid and prostate    Social History Social History   Tobacco Use  . Smoking status: Former Smoker    Last attempt to quit: 05/05/1997    Years since quitting: 20.2  . Smokeless tobacco: Never Used  Substance Use Topics  . Alcohol use: Yes    Comment: rare  . Drug use: No     Allergies   Patient has no known allergies.   Review of Systems Review of Systems  Ten systems reviewed and are negative for acute change, except as noted in the HPI.   Physical Exam Updated Vital Signs BP (!) 146/106 (BP Location: Left Arm)   Pulse 86   Temp 98.3 F (36.8 C) (Oral)   Resp 18   Ht 6' (1.829 m)   Wt 93.2 kg (205 lb 7.5 oz)   SpO2 98%   BMI 27.87 kg/m   Physical Exam  Constitutional: He  appears well-developed and well-nourished. No distress.  HENT:  Head: Normocephalic and atraumatic.  Eyes: Conjunctivae are normal. No scleral icterus.  Neck: Normal range of motion. Neck supple.  Cardiovascular: Normal rate, regular rhythm and normal heart sounds.  Pulmonary/Chest: Effort normal and breath sounds normal. No respiratory distress.  Abdominal: Soft. There is no tenderness.  Musculoskeletal: He exhibits no edema.  Neurological: He is alert.  Skin: Skin is warm and dry. He is not diaphoretic.  Psychiatric: His behavior is normal.  Anxious  Nursing note and vitals reviewed.    ED Treatments / Results  Labs (all labs ordered are listed, but only abnormal results are displayed) Labs Reviewed - No data to display  EKG None  Radiology No results  found.  Procedures Procedures (including critical care time)  Medications Ordered in ED Medications  diphenhydrAMINE (BENADRYL) capsule 25 mg (25 mg Oral Not Given 08/14/17 2132)     Initial Impression / Assessment and Plan / ED Course  I have reviewed the triage vital signs and the nursing notes.  Pertinent labs & imaging results that were available during my care of the patient were reviewed by me and considered in my medical decision making (see chart for details).     Patient without symptoms of gastritis.  His dose is not high enough to warrant observation.  I discussed ingestion with Dr. Anitra LauthPlunkett he feels that he is safe to go home.  I discussed reasons to seek immediate emergent care.  Patient advised to use Tums or other acid reducing medication for gastritis should he obtain it.  He may take the next dose of Mobic tomorrow evening I have discussed all medications in the NSAID class and he should avoid those medications while taking Mobic.  He appears appropriate for discharge at this time  Final Clinical Impressions(s) / ED Diagnoses   Final diagnoses:  Overdose of nonsteroidal anti-inflammatory drug (NSAID), accidental or unintentional, initial encounter    ED Discharge Orders    None       Arthor CaptainHarris, Cuma Polyakov, PA-C 08/14/17 2145    Gwyneth SproutPlunkett, [place], MD 08/15/17 [phone]

## 2017-08-14 NOTE — ED Notes (Signed)
Pt verbalizes understanding of dc instructions and denies any further needs this time 

## 2017-08-14 NOTE — ED Triage Notes (Signed)
Pt concerned he took ibuprofen 800mg  at 530pm and mobic 15mg  at 630pm-states he is itching with pain to both arms-NAD-steady gait

## 2017-08-14 NOTE — ED Notes (Signed)
ED Provider at bedside. 

## 2017-08-14 NOTE — Discharge Instructions (Addendum)
Get help right away if: °You think that you or someone else may have taken too much of a substance. °You or someone else is having symptoms of an overdose. °You have serious thoughts about hurting yourself or others. °You become confused. °You have chest pain. °You have trouble breathing. °You lose consciousness. °You have a seizure. °You have trouble staying awake. °

## 2017-08-17 DIAGNOSIS — F3181 Bipolar II disorder: Secondary | ICD-10-CM | POA: Diagnosis not present

## 2017-08-19 DIAGNOSIS — M531 Cervicobrachial syndrome: Secondary | ICD-10-CM | POA: Diagnosis not present

## 2017-08-19 DIAGNOSIS — M9901 Segmental and somatic dysfunction of cervical region: Secondary | ICD-10-CM | POA: Diagnosis not present

## 2017-08-19 DIAGNOSIS — M9903 Segmental and somatic dysfunction of lumbar region: Secondary | ICD-10-CM | POA: Diagnosis not present

## 2017-08-19 DIAGNOSIS — M9902 Segmental and somatic dysfunction of thoracic region: Secondary | ICD-10-CM | POA: Diagnosis not present

## 2017-08-24 DIAGNOSIS — M9903 Segmental and somatic dysfunction of lumbar region: Secondary | ICD-10-CM | POA: Diagnosis not present

## 2017-08-24 DIAGNOSIS — M531 Cervicobrachial syndrome: Secondary | ICD-10-CM | POA: Diagnosis not present

## 2017-08-24 DIAGNOSIS — M9902 Segmental and somatic dysfunction of thoracic region: Secondary | ICD-10-CM | POA: Diagnosis not present

## 2017-08-24 DIAGNOSIS — M9901 Segmental and somatic dysfunction of cervical region: Secondary | ICD-10-CM | POA: Diagnosis not present

## 2017-08-26 DIAGNOSIS — M9903 Segmental and somatic dysfunction of lumbar region: Secondary | ICD-10-CM | POA: Diagnosis not present

## 2017-08-26 DIAGNOSIS — M531 Cervicobrachial syndrome: Secondary | ICD-10-CM | POA: Diagnosis not present

## 2017-08-26 DIAGNOSIS — M9902 Segmental and somatic dysfunction of thoracic region: Secondary | ICD-10-CM | POA: Diagnosis not present

## 2017-08-26 DIAGNOSIS — M9901 Segmental and somatic dysfunction of cervical region: Secondary | ICD-10-CM | POA: Diagnosis not present

## 2017-08-31 DIAGNOSIS — M9903 Segmental and somatic dysfunction of lumbar region: Secondary | ICD-10-CM | POA: Diagnosis not present

## 2017-08-31 DIAGNOSIS — M9901 Segmental and somatic dysfunction of cervical region: Secondary | ICD-10-CM | POA: Diagnosis not present

## 2017-08-31 DIAGNOSIS — M9902 Segmental and somatic dysfunction of thoracic region: Secondary | ICD-10-CM | POA: Diagnosis not present

## 2017-08-31 DIAGNOSIS — M531 Cervicobrachial syndrome: Secondary | ICD-10-CM | POA: Diagnosis not present

## 2017-09-04 DIAGNOSIS — N059 Unspecified nephritic syndrome with unspecified morphologic changes: Secondary | ICD-10-CM | POA: Diagnosis not present

## 2017-09-04 DIAGNOSIS — R350 Frequency of micturition: Secondary | ICD-10-CM | POA: Diagnosis not present

## 2017-09-04 DIAGNOSIS — N342 Other urethritis: Secondary | ICD-10-CM | POA: Diagnosis not present

## 2017-09-07 DIAGNOSIS — M9903 Segmental and somatic dysfunction of lumbar region: Secondary | ICD-10-CM | POA: Diagnosis not present

## 2017-09-07 DIAGNOSIS — M531 Cervicobrachial syndrome: Secondary | ICD-10-CM | POA: Diagnosis not present

## 2017-09-07 DIAGNOSIS — M9902 Segmental and somatic dysfunction of thoracic region: Secondary | ICD-10-CM | POA: Diagnosis not present

## 2017-09-07 DIAGNOSIS — M9901 Segmental and somatic dysfunction of cervical region: Secondary | ICD-10-CM | POA: Diagnosis not present

## 2017-09-10 DIAGNOSIS — M9902 Segmental and somatic dysfunction of thoracic region: Secondary | ICD-10-CM | POA: Diagnosis not present

## 2017-09-10 DIAGNOSIS — N411 Chronic prostatitis: Secondary | ICD-10-CM | POA: Diagnosis not present

## 2017-09-10 DIAGNOSIS — Z79899 Other long term (current) drug therapy: Secondary | ICD-10-CM | POA: Diagnosis not present

## 2017-09-10 DIAGNOSIS — M9901 Segmental and somatic dysfunction of cervical region: Secondary | ICD-10-CM | POA: Diagnosis not present

## 2017-09-10 DIAGNOSIS — M531 Cervicobrachial syndrome: Secondary | ICD-10-CM | POA: Diagnosis not present

## 2017-09-10 DIAGNOSIS — N509 Disorder of male genital organs, unspecified: Secondary | ICD-10-CM | POA: Diagnosis not present

## 2017-09-10 DIAGNOSIS — M9903 Segmental and somatic dysfunction of lumbar region: Secondary | ICD-10-CM | POA: Diagnosis not present

## 2017-09-14 DIAGNOSIS — M9902 Segmental and somatic dysfunction of thoracic region: Secondary | ICD-10-CM | POA: Diagnosis not present

## 2017-09-14 DIAGNOSIS — M9901 Segmental and somatic dysfunction of cervical region: Secondary | ICD-10-CM | POA: Diagnosis not present

## 2017-09-14 DIAGNOSIS — M9903 Segmental and somatic dysfunction of lumbar region: Secondary | ICD-10-CM | POA: Diagnosis not present

## 2017-09-14 DIAGNOSIS — M531 Cervicobrachial syndrome: Secondary | ICD-10-CM | POA: Diagnosis not present

## 2017-09-16 DIAGNOSIS — M9901 Segmental and somatic dysfunction of cervical region: Secondary | ICD-10-CM | POA: Diagnosis not present

## 2017-09-16 DIAGNOSIS — M531 Cervicobrachial syndrome: Secondary | ICD-10-CM | POA: Diagnosis not present

## 2017-09-16 DIAGNOSIS — M9903 Segmental and somatic dysfunction of lumbar region: Secondary | ICD-10-CM | POA: Diagnosis not present

## 2017-09-16 DIAGNOSIS — M9902 Segmental and somatic dysfunction of thoracic region: Secondary | ICD-10-CM | POA: Diagnosis not present

## 2017-09-21 DIAGNOSIS — R300 Dysuria: Secondary | ICD-10-CM | POA: Diagnosis not present

## 2017-09-21 DIAGNOSIS — Z0000 Encounter for general adult medical examination without abnormal findings: Secondary | ICD-10-CM | POA: Diagnosis not present

## 2017-09-22 DIAGNOSIS — M9902 Segmental and somatic dysfunction of thoracic region: Secondary | ICD-10-CM | POA: Diagnosis not present

## 2017-09-22 DIAGNOSIS — M9903 Segmental and somatic dysfunction of lumbar region: Secondary | ICD-10-CM | POA: Diagnosis not present

## 2017-09-22 DIAGNOSIS — M9901 Segmental and somatic dysfunction of cervical region: Secondary | ICD-10-CM | POA: Diagnosis not present

## 2017-09-22 DIAGNOSIS — M531 Cervicobrachial syndrome: Secondary | ICD-10-CM | POA: Diagnosis not present

## 2017-09-23 DIAGNOSIS — M9903 Segmental and somatic dysfunction of lumbar region: Secondary | ICD-10-CM | POA: Diagnosis not present

## 2017-09-23 DIAGNOSIS — M531 Cervicobrachial syndrome: Secondary | ICD-10-CM | POA: Diagnosis not present

## 2017-09-23 DIAGNOSIS — M9902 Segmental and somatic dysfunction of thoracic region: Secondary | ICD-10-CM | POA: Diagnosis not present

## 2017-09-23 DIAGNOSIS — M9901 Segmental and somatic dysfunction of cervical region: Secondary | ICD-10-CM | POA: Diagnosis not present

## 2017-09-25 DIAGNOSIS — N411 Chronic prostatitis: Secondary | ICD-10-CM | POA: Diagnosis not present

## 2017-09-30 DIAGNOSIS — Z9889 Other specified postprocedural states: Secondary | ICD-10-CM | POA: Diagnosis not present

## 2017-09-30 DIAGNOSIS — Z1211 Encounter for screening for malignant neoplasm of colon: Secondary | ICD-10-CM | POA: Diagnosis not present

## 2017-09-30 DIAGNOSIS — R1310 Dysphagia, unspecified: Secondary | ICD-10-CM | POA: Diagnosis not present

## 2017-09-30 DIAGNOSIS — K219 Gastro-esophageal reflux disease without esophagitis: Secondary | ICD-10-CM | POA: Diagnosis not present

## 2017-10-02 DIAGNOSIS — N411 Chronic prostatitis: Secondary | ICD-10-CM | POA: Diagnosis not present

## 2017-10-02 DIAGNOSIS — R350 Frequency of micturition: Secondary | ICD-10-CM | POA: Diagnosis not present

## 2017-11-09 DIAGNOSIS — F3181 Bipolar II disorder: Secondary | ICD-10-CM | POA: Diagnosis not present

## 2017-11-11 DIAGNOSIS — K648 Other hemorrhoids: Secondary | ICD-10-CM | POA: Diagnosis not present

## 2017-11-11 DIAGNOSIS — K219 Gastro-esophageal reflux disease without esophagitis: Secondary | ICD-10-CM | POA: Diagnosis not present

## 2017-11-11 DIAGNOSIS — Z1211 Encounter for screening for malignant neoplasm of colon: Secondary | ICD-10-CM | POA: Diagnosis not present

## 2017-12-07 DIAGNOSIS — R1084 Generalized abdominal pain: Secondary | ICD-10-CM | POA: Diagnosis not present

## 2017-12-07 DIAGNOSIS — R748 Abnormal levels of other serum enzymes: Secondary | ICD-10-CM | POA: Diagnosis not present

## 2017-12-15 DIAGNOSIS — R1084 Generalized abdominal pain: Secondary | ICD-10-CM | POA: Diagnosis not present

## 2017-12-22 DIAGNOSIS — R1084 Generalized abdominal pain: Secondary | ICD-10-CM | POA: Diagnosis not present

## 2017-12-22 DIAGNOSIS — K4090 Unilateral inguinal hernia, without obstruction or gangrene, not specified as recurrent: Secondary | ICD-10-CM | POA: Diagnosis not present

## 2017-12-22 DIAGNOSIS — N281 Cyst of kidney, acquired: Secondary | ICD-10-CM | POA: Diagnosis not present

## 2017-12-31 DIAGNOSIS — K4090 Unilateral inguinal hernia, without obstruction or gangrene, not specified as recurrent: Secondary | ICD-10-CM | POA: Diagnosis not present

## 2018-02-08 DIAGNOSIS — F3181 Bipolar II disorder: Secondary | ICD-10-CM | POA: Diagnosis not present

## 2018-03-01 DIAGNOSIS — K4090 Unilateral inguinal hernia, without obstruction or gangrene, not specified as recurrent: Secondary | ICD-10-CM | POA: Diagnosis not present

## 2018-03-09 DIAGNOSIS — M545 Low back pain: Secondary | ICD-10-CM | POA: Diagnosis not present

## 2018-03-09 DIAGNOSIS — R21 Rash and other nonspecific skin eruption: Secondary | ICD-10-CM | POA: Diagnosis not present

## 2018-03-31 DIAGNOSIS — K4090 Unilateral inguinal hernia, without obstruction or gangrene, not specified as recurrent: Secondary | ICD-10-CM | POA: Diagnosis not present

## 2018-04-26 DIAGNOSIS — H66001 Acute suppurative otitis media without spontaneous rupture of ear drum, right ear: Secondary | ICD-10-CM | POA: Diagnosis not present

## 2018-04-26 DIAGNOSIS — Z23 Encounter for immunization: Secondary | ICD-10-CM | POA: Diagnosis not present

## 2018-04-26 DIAGNOSIS — J069 Acute upper respiratory infection, unspecified: Secondary | ICD-10-CM | POA: Diagnosis not present
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
## 2018-05-09 ENCOUNTER — Encounter: Payer: Self-pay | Admitting: Emergency Medicine

## 2018-05-09 ENCOUNTER — Emergency Department
Admission: EM | Admit: 2018-05-09 | Discharge: 2018-05-09 | Disposition: A | Discharge status: Home / Self Care | Payer: BLUE CROSS/BLUE SHIELD | Source: Home / Self Care | Attending: Family Medicine | Admitting: Family Medicine

## 2018-05-09 ENCOUNTER — Other Ambulatory Visit: Payer: Self-pay

## 2018-05-09 DIAGNOSIS — H5712 Ocular pain, left eye: Secondary | ICD-10-CM

## 2018-05-09 DIAGNOSIS — H18822 Corneal disorder due to contact lens, left eye: Secondary | ICD-10-CM | POA: Diagnosis not present

## 2018-05-09 MED ORDER — KETOROLAC TROMETHAMINE 0.5 % OP SOLN: 1.0000 [drp] | Freq: Four times a day (QID) | OPHTHALMIC | 0 refills | Status: DC

## 2018-05-09 NOTE — Discharge Instructions (Addendum)
Avoid using contact lens until symptoms resolved. Recommend follow-up with ophthalmologist to re-evaluate your present contact lens prescription

## 2018-05-09 NOTE — ED Triage Notes (Signed)
Patient has had scratchy feeling in left  Eye for about 2 weeks; no known foreign body. Discomfort not clearing.

## 2018-05-09 NOTE — ED Provider Notes (Signed)
[name]    CSN: [id number] Arrival date & time: 05/09/18  1148     History   Chief Complaint Chief Complaint  Patient presents with  . Eye Problem    HPI [name] is a 53 y.o. male.   About 2 weeks ago patient began to notice a mild foreign body sensation in his left eye.  The sensation improved somewhat with lubricating eye drops.  He denies eye pain, eyelid swelling, eye redness, and drainage from eye.  He denies changes in vision. He states that he wears contact lens.  After not wearing his lens yesterday, his eye feels slightly better.  The history is provided by the patient.  Eye Problem  Location:  Left eye Quality:  Foreign body sensation Severity:  Mild Onset quality:  Sudden Duration:  2 weeks Timing:  Constant Progression:  Improving Chronicity:  New Context: contact lenses   Context: not burn, not chemical exposure, not direct trauma, not foreign body, not scratch, not smoke exposure and not UV exposure   Relieved by:  Nothing Worsened by:  Nothing Ineffective treatments:  None tried Associated symptoms: no blurred vision, no crusting, no decreased vision, no discharge, no double vision, no facial rash, no headaches, no inflammation, no itching, no photophobia, no redness, no scotomas, no swelling, no tearing and no tingling   Risk factors: no conjunctival hemorrhage, no exposure to pinkeye and no recent URI     Past Medical History:  Diagnosis Date  . GERD (gastroesophageal reflux disease)     Patient Active Problem List   Diagnosis Date Noted  . Thrombosed external hemorrhoid 12/27/2012    Past Surgical History:  Procedure Laterality Date  . CHOLECYSTECTOMY    . HERNIA REPAIR     *2 in same spot  . NISSEN FUNDOPLICATION         Home Medications    Prior to Admission medications   Medication Sig Start Date End Date Taking? Authorizing Provider  buPROPion (WELLBUTRIN) 100 MG tablet Take 100 mg by mouth 2 (two) times  daily.    [provider]  cyclobenzaprine (FLEXERIL) 10 MG tablet Take 1 tablet (10 mg total) by mouth 2 (two) times daily as needed for muscle spasms. 07/16/17   Khatri, Hina, PA-C  ketorolac (ACULAR) 0.5 % ophthalmic solution Place 1 drop into the left eye 4 (four) times daily. 05/09/18   Lattie HawBeese, Gagandeep Pettet A, MD  LamoTRIgine (LAMICTAL PO) Take by mouth.    [provider]  naproxen (NAPROSYN) 500 MG tablet Take 1 tablet (500 mg total) by mouth 2 (two) times daily. 07/16/17   Khatri, Hina, PA-C  omeprazole-sodium bicarbonate (ZEGERID) 40-1100 MG per capsule Take 1 capsule by mouth daily before breakfast.    [provider]  OXcarbazepine (TRILEPTAL PO) Take by mouth.    [provider]  pentosan polysulfate (ELMIRON) 100 MG capsule Take 100 mg by mouth 3 (three) times daily before meals.    [provider]    Family History Family History  Problem Relation Age of Onset  . Cancer Father        thyroid and prostate    Social History Social History   Tobacco Use  . Smoking status: Former Smoker    Last attempt to quit: 05/05/1997    Years since quitting: 21.0  . Smokeless tobacco: Never Used  Substance Use Topics  . Alcohol use: Yes    Comment: rare  . Drug use: No  Allergies   Patient has no known allergies.   Review of Systems Review of Systems  HENT: Negative for congestion, ear pain, facial swelling, rhinorrhea and sore throat.   Eyes: Negative for blurred vision, double vision, photophobia, discharge, redness and itching.  Neurological: Negative for tingling and headaches.  All other systems reviewed and are negative.    Physical Exam Triage Vital Signs ED Triage Vitals  Enc Vitals Group     BP 05/09/18 1224 111/75     Pulse Rate 05/09/18 1224 94     Resp 05/09/18 1224 16     Temp 05/09/18 1224 98 F (36.7 C)     Temp Source 05/09/18 1224 Oral     SpO2 05/09/18 1224 96 %     Weight 05/09/18 1225 208 lb (94.3 kg)      Height 05/09/18 1225 6\' 1"  (1.854 m)     Head Circumference --      Peak Flow --      Pain Score 05/09/18 1225 3     Pain Loc --      Pain Edu? --      Excl. in GC? --    No data found.  Updated Vital Signs BP 111/75 (BP Location: Right Arm)   Pulse 94   Temp 98 F (36.7 C) (Oral)   Resp 16   Ht 6\' 1"  (1.854 m)   Wt 94.3 kg   SpO2 96%   BMI 27.44 kg/m   Visual Acuity Right Eye Distance: 20/20 Left Eye Distance: 20/20 Bilateral Distance: (with glasses)  Right Eye Near:   Left Eye Near:    Bilateral Near:     Physical Exam Vitals signs and nursing note reviewed.  Constitutional:      Appearance: Normal appearance. He is not ill-appearing.  HENT:     Head: Normocephalic.     Right Ear: Tympanic membrane and ear canal normal.     Left Ear: Tympanic membrane and ear canal normal.     Nose: Nose normal. No rhinorrhea.     Mouth/Throat:     Pharynx: Oropharynx is clear.  Eyes:     General: Lids are normal. Lids are everted, no foreign bodies appreciated. Vision grossly intact. Gaze aligned appropriately.        Left eye: No foreign body, discharge or hordeolum.     Extraocular Movements: Extraocular movements intact.     Conjunctiva/sclera: Conjunctivae normal.     Left eye: Left conjunctiva is not injected. No chemosis, exudate or hemorrhage.     Comments: Fluorescein to left eye:  Faint "ground glass" uptake lower margin cornea as noted on diagram.  Fundi benign.  Cardiovascular:     Rate and Rhythm: Normal rate.  Pulmonary:     Effort: Pulmonary effort is normal.  Lymphadenopathy:     Cervical: No cervical adenopathy.  Skin:    General: Skin is warm and dry.  Neurological:     Mental Status: He is alert.      UC Treatments / Results  Labs (all labs ordered are listed, but only abnormal results are displayed) Labs Reviewed - No data to display  EKG None  Radiology No results found.  Procedures Procedures (including critical care  time)  Medications Ordered in UC Medications - No data to display  Initial Impression / Assessment and Plan / UC Course  I have reviewed the triage vital signs and the nursing notes.  Pertinent labs & imaging results that were available during my care  of the patient were reviewed by me and considered in my medical decision making (see chart for details).    Minimal abrasion noted, likely from contact lens. Begin Acular solution.  Followup with ophthalmologist if not improved 3 days.   Final Clinical Impressions(s) / UC Diagnoses   Final diagnoses:  Corneal abrasion of left eye due to contact lens     Discharge Instructions     Avoid using contact lens until symptoms resolved. Recommend follow-up with ophthalmologist to re-evaluate your present contact lens prescription    ED Prescriptions    Medication Sig Dispense Auth. Provider   ketorolac (ACULAR) 0.5 % ophthalmic solution Place 1 drop into the left eye 4 (four) times daily. 3 mL Lattie Haw, MD         Lattie Haw, MD 05/09/18 [phone]

## 2019-02-23 DIAGNOSIS — F3181 Bipolar II disorder: Secondary | ICD-10-CM | POA: Diagnosis not present

## 2019-05-25 DIAGNOSIS — F3181 Bipolar II disorder: Secondary | ICD-10-CM | POA: Diagnosis not present

## 2019-08-24 DIAGNOSIS — F3181 Bipolar II disorder: Secondary | ICD-10-CM | POA: Diagnosis not present

## 2019-10-12 DIAGNOSIS — Z125 Encounter for screening for malignant neoplasm of prostate: Secondary | ICD-10-CM | POA: Diagnosis not present

## 2019-10-12 DIAGNOSIS — Z13220 Encounter for screening for lipoid disorders: Secondary | ICD-10-CM | POA: Diagnosis not present

## 2019-10-12 DIAGNOSIS — Z0000 Encounter for general adult medical examination without abnormal findings: Secondary | ICD-10-CM | POA: Diagnosis not present

## 2019-10-17 DIAGNOSIS — Z23 Encounter for immunization: Secondary | ICD-10-CM | POA: Diagnosis not present

## 2019-10-17 DIAGNOSIS — K219 Gastro-esophageal reflux disease without esophagitis: Secondary | ICD-10-CM | POA: Diagnosis not present

## 2019-10-17 DIAGNOSIS — L719 Rosacea, unspecified: Secondary | ICD-10-CM | POA: Diagnosis not present

## 2019-10-17 DIAGNOSIS — K649 Unspecified hemorrhoids: Secondary | ICD-10-CM | POA: Diagnosis not present

## 2019-10-17 DIAGNOSIS — Z0000 Encounter for general adult medical examination without abnormal findings: Secondary | ICD-10-CM | POA: Diagnosis not present

## 2019-11-13 DIAGNOSIS — F3181 Bipolar II disorder: Secondary | ICD-10-CM | POA: Diagnosis not present

## 2019-12-25 DIAGNOSIS — R1033 Periumbilical pain: Secondary | ICD-10-CM | POA: Diagnosis not present

## 2020-01-18 DIAGNOSIS — R1033 Periumbilical pain: Secondary | ICD-10-CM | POA: Diagnosis not present

## 2020-02-03 DIAGNOSIS — Z20822 Contact with and (suspected) exposure to covid-19: Secondary | ICD-10-CM | POA: Diagnosis not present

## 2020-02-03 DIAGNOSIS — R1084 Generalized abdominal pain: Secondary | ICD-10-CM | POA: Diagnosis not present

## 2020-02-03 DIAGNOSIS — R1031 Right lower quadrant pain: Secondary | ICD-10-CM | POA: Diagnosis not present

## 2020-02-03 DIAGNOSIS — Z79899 Other long term (current) drug therapy: Secondary | ICD-10-CM | POA: Diagnosis not present

## 2020-02-05 DIAGNOSIS — F3181 Bipolar II disorder: Secondary | ICD-10-CM | POA: Diagnosis not present

## 2020-02-18 DIAGNOSIS — R1031 Right lower quadrant pain: Secondary | ICD-10-CM | POA: Diagnosis not present

## 2020-02-28 DIAGNOSIS — Z712 Person consulting for explanation of examination or test findings: Secondary | ICD-10-CM | POA: Diagnosis not present

## 2020-02-28 DIAGNOSIS — Z79899 Other long term (current) drug therapy: Secondary | ICD-10-CM | POA: Diagnosis not present

## 2020-02-28 DIAGNOSIS — K4090 Unilateral inguinal hernia, without obstruction or gangrene, not specified as recurrent: Secondary | ICD-10-CM | POA: Diagnosis not present

## 2020-02-28 DIAGNOSIS — R1084 Generalized abdominal pain: Secondary | ICD-10-CM | POA: Diagnosis not present

## 2020-02-28 DIAGNOSIS — R1031 Right lower quadrant pain: Secondary | ICD-10-CM | POA: Diagnosis not present

## 2020-02-28 DIAGNOSIS — Z20822 Contact with and (suspected) exposure to covid-19: Secondary | ICD-10-CM | POA: Diagnosis not present

## 2020-03-14 DIAGNOSIS — R1030 Lower abdominal pain, unspecified: Secondary | ICD-10-CM | POA: Diagnosis not present

## 2020-03-28 DIAGNOSIS — R109 Unspecified abdominal pain: Secondary | ICD-10-CM | POA: Diagnosis not present

## 2020-03-30 DIAGNOSIS — Z79899 Other long term (current) drug therapy: Secondary | ICD-10-CM | POA: Diagnosis not present

## 2020-03-30 DIAGNOSIS — G8929 Other chronic pain: Secondary | ICD-10-CM | POA: Diagnosis not present

## 2020-03-30 DIAGNOSIS — R109 Unspecified abdominal pain: Secondary | ICD-10-CM | POA: Diagnosis not present

## 2020-03-30 DIAGNOSIS — K4090 Unilateral inguinal hernia, without obstruction or gangrene, not specified as recurrent: Secondary | ICD-10-CM | POA: Diagnosis not present

## 2020-04-04 ENCOUNTER — Ambulatory Visit: Payer: Self-pay | Admitting: Surgery

## 2020-04-04 DIAGNOSIS — K429 Umbilical hernia without obstruction or gangrene: Secondary | ICD-10-CM | POA: Diagnosis not present

## 2020-04-04 DIAGNOSIS — R1031 Right lower quadrant pain: Secondary | ICD-10-CM | POA: Diagnosis not present

## 2020-04-04 DIAGNOSIS — G8929 Other chronic pain: Secondary | ICD-10-CM | POA: Diagnosis not present

## 2020-04-04 NOTE — H&P (Signed)
Josefa Half Appointment: 04/04/2020 2:30 PM Location: [facility] Patient #: [id number] DOB: 1965/05/31 Married / Language: [place] / Race: White Male  History of Present Illness Quentin Ore MD; 04/04/2020 3:18 PM) Patient words: Mr Mory is a 54 year old male who presents the office due to right groin pain after inguinal hernia repair 3 years ago.  The patient's pain started in August, he thinks he might of lifted something to heavy which triggered the pain. The pain is located inferior and to the right of the umbilicus up on the abdominal wall. He can push on the area and finding the spot of pain. He thought it may be a tach or pulled muscle, however the pain has not improved over the last 3 months so he presented to be evaluated. He describes as a deep tissue pain. He does not notice any bulge or recurrent hernia.  The patient is a 54 year old male.   Past Surgical History Laurette Schimke, [place]; 04/04/2020 2:31 PM) Gallbladder Surgery - Laparoscopic Laparoscopic Inguinal Hernia Surgery Right. Nissen Fundoplication Open Inguinal Hernia Surgery Left. multiple Oral Surgery  Diagnostic Studies History Laurette Schimke, [place]; 04/04/2020 2:31 PM) Colonoscopy 1-5 years ago  Allergies Laurette Schimke, RMA; 04/04/2020 2:31 PM) No Known Drug Allergies [04/04/2020]: Allergies Reconciled  Medication History Laurette Schimke, [place]; 04/04/2020 2:33 PM) lamoTRIgine (150MG  Tablet, Oral) Active. Omeprazole-Sodium Bicarbonate (40-1100MG  Capsule, Oral) Active. OXcarbazepine (600MG  Tablet, Oral) Active. Elmiron (100MG  Capsule, Oral) Active. Naproxen (500MG  Tablet, Oral) Active. Medications Reconciled  Social History , ; 04/04/2020 2:31 PM) Alcohol use Occasional alcohol use. Caffeine use Coffee. No drug use Tobacco use Former smoker.  Family History , Laurette Schimke; 04/04/2020 2:31 PM) Prostate Cancer Father. Thyroid problems Father,  Mother.  Other Problems 14/05/2019, Laurette Schimke; 04/04/2020 2:31 PM) Anxiety Disorder Bladder Problems Cholelithiasis Enlarged Prostate Gastroesophageal Reflux Disease Inguinal Hernia Kidney Stone     Review of Systems 14/05/2019 RMA; 04/04/2020 2:31 PM) General Not Present- Appetite Loss, Chills, Fatigue, Fever, Night Sweats, Weight Gain and Weight Loss. Skin Not Present- Change in Wart/Mole, Dryness, Hives, Jaundice, New Lesions, Non-Healing Wounds, Rash and Ulcer. HEENT Present- Seasonal Allergies and Wears glasses/contact lenses. Not Present- Earache, Hearing Loss, Hoarseness, Nose Bleed, Oral Ulcers, Ringing in the Ears, Sinus Pain, Sore Throat, Visual Disturbances and Yellow Eyes. Respiratory Not Present- Bloody sputum, Chronic Cough, Difficulty Breathing, Snoring and Wheezing. Breast Not Present- Breast Mass, Breast Pain, Nipple Discharge and Skin Changes. Cardiovascular Not Present- Chest Pain, Difficulty Breathing Lying Down, Leg Cramps, Palpitations, Rapid Heart Rate, Shortness of Breath and Swelling of Extremities. Gastrointestinal Present- Abdominal Pain. Not Present- Bloating, Bloody Stool, Change in Bowel Habits, Chronic diarrhea, Constipation, Difficulty Swallowing, Excessive gas, Gets full quickly at meals, Hemorrhoids, Indigestion, Nausea, Rectal Pain and Vomiting. Male Genitourinary Not Present- Blood in Urine, Change in Urinary Stream, Frequency, Impotence, Nocturia, Painful Urination, Urgency and Urine Leakage. Musculoskeletal Not Present- Back Pain, Joint Pain, Joint Stiffness, Muscle Pain, Muscle Weakness and Swelling of Extremities. Neurological Not Present- Decreased Memory, Fainting, Headaches, Numbness, Seizures, Tingling, Tremor, Trouble walking and Weakness. Psychiatric Present- Anxiety. Not Present- Bipolar, Change in Sleep Pattern, Depression, Fearful and Frequent crying. Endocrine Not Present- Cold Intolerance, Excessive Hunger, Hair Changes, Heat  Intolerance, Hot flashes and New Diabetes. Hematology Not Present- Blood Thinners, Easy Bruising, Excessive bleeding, Gland problems, HIV and Persistent Infections.  Vitals [place] RMA; 04/04/2020 2:33 PM) 04/04/2020 2:33 PM Weight: 206.25 lb Height: 71in Body Surface Area: 2.14 m Body Mass Index: 28.77 kg/m  Temp.: 29F  Pulse: 109 (Regular)  P.OX: 98% (Room air) BP: 126/80(Sitting, Left Arm, Standard)        Physical Exam Quentin Ore MD; 04/04/2020 3:21 PM)  General Mental Status-Alert. General Appearance-Consistent with stated age. Posture-Normal posture. Voice-Normal.  Integumentary Note: no rash or lesion on limited exam  Head and Neck Head -Note:atraumatic, normocephalic.  Face Strength and Tone - facial muscle strength and tone is normal.  Eye Eyeball - Bilateral-Extraocular movements intact. Sclera/Conjunctiva - Bilateral-Normal.  Chest and Lung Exam Chest and lung exam reveals -quiet, even and easy respiratory effort with no use of accessory muscles.  Abdomen Note: Reducible umbilical hernia. Hyperalgesia in a band to the right of the umbilicus. Numbness in the suprapubic area bilaterally. Normal sensation lower in the groin. No palpable recurrent inguinal hernias on exam.  Neuropsychiatric Mental status exam performed with findings of-able to articulate well with normal speech/language, rate, volume and coherence. The patient's mood and affect are described as -normal. Judgment and Insight-insight is appropriate concerning matters relevant to self and the patient displays appropriate judgment regarding every day activities. Thought Processes/Cognitive Function-aware of current events.  Musculoskeletal Note: strength symmetrical throughout, no deformity    Assessment & Plan Quentin Ore MD; 04/04/2020 3:22 PM)  UMBILICAL HERNIA (K42.9) Story: Mr Zanetti has a small umbilical hernia on  CT. Impression: I recommended umbilical hernia repair to the patient. The risks, benefits, and alternatives to surgery were discussed with patient. We will perform diagnostic laparoscopy at the time umbilical hernia repair to further investigate his right groin pain after previous right laparoscopic inguinal hernia repair.   GROIN PAIN, CHRONIC, RIGHT (R10.31) Story: The patient's CT scan was reviewed and interpreted independently. Tacks noted near Cooper's ligament, no visible tacks on CT near the area of pain on exam. Small umbilical hernia present Impression: We will start with a diagnostic laparoscopy as mentioned above. If this fails to reveal the etiology of his pain we will proceed with pain medicine consultation for nerve block. We discussed further workup may be necessary and eventually he may be facing right inguinal hernia mesh removal if we are unable to identify the etiology of his pain.

## 2020-04-25 ENCOUNTER — Encounter (HOSPITAL_BASED_OUTPATIENT_CLINIC_OR_DEPARTMENT_OTHER): Payer: Self-pay | Admitting: Surgery

## 2020-04-25 ENCOUNTER — Other Ambulatory Visit: Payer: Self-pay

## 2020-04-25 NOTE — Progress Notes (Addendum)
ADDENDUM:  Called and spoke w/ pt via phone informed him to arrive at 0730, not 0800, and be sure to quarantine. Pt verbalized understanding.   Spoke w/ via phone for pre-op interview--- PT Lab needs dos----no               Lab results------  no COVID test ------ 04-26-2020 @ 0900 Arrive at ------- 0800 NPO after MN NO Solid Food.  Clear liquids from MN until--- 0700 Medications to take morning of surgery ----- AM Meds Diabetic medication ----- n/a Patient Special Instructions ----- n/a Pre-Op special Istructions ----- n/a Patient verbalized understanding of instructions that were given at this phone interview. Patient denies shortness of breath, chest pain, fever, cough at this phone interview.

## 2020-04-26 ENCOUNTER — Other Ambulatory Visit (HOSPITAL_COMMUNITY)
Admission: RE | Admit: 2020-04-26 | Discharge: 2020-04-26 | Disposition: A | Discharge status: Home / Self Care | Payer: BC Managed Care – PPO | Source: Ambulatory Visit | Attending: Surgery | Admitting: Surgery

## 2020-04-26 DIAGNOSIS — Z20822 Contact with and (suspected) exposure to covid-19: Secondary | ICD-10-CM | POA: Insufficient documentation

## 2020-04-26 DIAGNOSIS — Z01812 Encounter for preprocedural laboratory examination: Secondary | ICD-10-CM | POA: Diagnosis not present

## 2020-04-26 LAB — SARS CORONAVIRUS 2 (TAT 6-24 HRS): SARS Coronavirus 2: NEGATIVE

## 2020-04-30 ENCOUNTER — Encounter (HOSPITAL_BASED_OUTPATIENT_CLINIC_OR_DEPARTMENT_OTHER)
Admission: RE | Disposition: A | Discharge status: Home / Self Care | Payer: Self-pay | Source: Home / Self Care | Attending: Surgery

## 2020-04-30 ENCOUNTER — Ambulatory Visit (HOSPITAL_BASED_OUTPATIENT_CLINIC_OR_DEPARTMENT_OTHER): Payer: BC Managed Care – PPO | Admitting: Anesthesiology

## 2020-04-30 ENCOUNTER — Ambulatory Visit (HOSPITAL_BASED_OUTPATIENT_CLINIC_OR_DEPARTMENT_OTHER)
Admission: RE | Admit: 2020-04-30 | Discharge: 2020-04-30 | Disposition: A | Discharge status: Home / Self Care | Payer: BC Managed Care – PPO | Attending: Surgery | Admitting: Surgery

## 2020-04-30 ENCOUNTER — Encounter (HOSPITAL_BASED_OUTPATIENT_CLINIC_OR_DEPARTMENT_OTHER): Payer: Self-pay | Admitting: Surgery

## 2020-04-30 ENCOUNTER — Other Ambulatory Visit: Payer: Self-pay

## 2020-04-30 DIAGNOSIS — K429 Umbilical hernia without obstruction or gangrene: Secondary | ICD-10-CM | POA: Insufficient documentation

## 2020-04-30 DIAGNOSIS — R1030 Lower abdominal pain, unspecified: Secondary | ICD-10-CM | POA: Insufficient documentation

## 2020-04-30 DIAGNOSIS — Z79899 Other long term (current) drug therapy: Secondary | ICD-10-CM | POA: Diagnosis not present

## 2020-04-30 DIAGNOSIS — K219 Gastro-esophageal reflux disease without esophagitis: Secondary | ICD-10-CM | POA: Diagnosis not present

## 2020-04-30 DIAGNOSIS — K644 Residual hemorrhoidal skin tags: Secondary | ICD-10-CM | POA: Diagnosis not present

## 2020-04-30 DIAGNOSIS — N400 Enlarged prostate without lower urinary tract symptoms: Secondary | ICD-10-CM | POA: Diagnosis not present

## 2020-04-30 DIAGNOSIS — Z87891 Personal history of nicotine dependence: Secondary | ICD-10-CM | POA: Diagnosis not present

## 2020-04-30 DIAGNOSIS — Z9889 Other specified postprocedural states: Secondary | ICD-10-CM | POA: Insufficient documentation

## 2020-04-30 HISTORY — PX: LAPAROSCOPY: SHX197

## 2020-04-30 HISTORY — PX: UMBILICAL HERNIA REPAIR: SHX196

## 2020-04-30 HISTORY — DX: Presence of spectacles and contact lenses: Z97.3

## 2020-04-30 HISTORY — DX: Benign prostatic hyperplasia without lower urinary tract symptoms: N40.0

## 2020-04-30 HISTORY — DX: Other chronic pain: G89.29

## 2020-04-30 HISTORY — DX: Personal history of other diseases of the digestive system: Z87.19

## 2020-04-30 HISTORY — DX: Generalized anxiety disorder: F41.1

## 2020-04-30 HISTORY — DX: Chronic prostatitis: N41.1

## 2020-04-30 HISTORY — DX: Unspecified symptoms and signs involving the genitourinary system: R39.9

## 2020-04-30 HISTORY — DX: Personal history of urinary calculi: Z87.442

## 2020-04-30 HISTORY — DX: Unspecified hemorrhoids: K64.9

## 2020-04-30 HISTORY — DX: Umbilical hernia without obstruction or gangrene: K42.9

## 2020-04-30 HISTORY — DX: Dermatitis, unspecified: L30.9

## 2020-04-30 HISTORY — DX: Personal history of other diseases of the respiratory system: Z87.09

## 2020-04-30 SURGERY — LAPAROSCOPY, DIAGNOSTIC
Anesthesia: General | Site: Abdomen

## 2020-04-30 MED ORDER — 0.9 % SODIUM CHLORIDE (POUR BTL) OPTIME
TOPICAL | Status: DC | PRN
  Administered 2020-04-30: 500 mL

## 2020-04-30 MED ORDER — FENTANYL CITRATE (PF) 100 MCG/2ML IJ SOLN
INTRAMUSCULAR | Status: DC | PRN
  Administered 2020-04-30 (×2): 25 ug via INTRAVENOUS
  Administered 2020-04-30 (×3): 50 ug via INTRAVENOUS

## 2020-04-30 MED ORDER — MIDAZOLAM HCL 2 MG/2ML IJ SOLN
INTRAMUSCULAR | Status: AC
  Filled 2020-04-30: qty 2

## 2020-04-30 MED ORDER — ACETAMINOPHEN 500 MG PO TABS
1000.0000 mg | ORAL_TABLET | ORAL | Status: AC
  Administered 2020-04-30: 1000 mg via ORAL

## 2020-04-30 MED ORDER — LACTATED RINGERS IV SOLN: INTRAVENOUS | Status: DC

## 2020-04-30 MED ORDER — LIDOCAINE HCL (CARDIAC) PF 100 MG/5ML IV SOSY
PREFILLED_SYRINGE | INTRAVENOUS | Status: DC | PRN
  Administered 2020-04-30: 80 mg via INTRAVENOUS

## 2020-04-30 MED ORDER — CHLORHEXIDINE GLUCONATE CLOTH 2 % EX PADS: 6.0000 | MEDICATED_PAD | Freq: Once | CUTANEOUS | Status: DC

## 2020-04-30 MED ORDER — PROPOFOL 500 MG/50ML IV EMUL
INTRAVENOUS | Status: AC
  Filled 2020-04-30: qty 50

## 2020-04-30 MED ORDER — MIDAZOLAM HCL 5 MG/5ML IJ SOLN
INTRAMUSCULAR | Status: DC | PRN
  Administered 2020-04-30: 2 mg via INTRAVENOUS

## 2020-04-30 MED ORDER — CEFAZOLIN SODIUM-DEXTROSE 2-4 GM/100ML-% IV SOLN
2.0000 g | INTRAVENOUS | Status: AC
  Administered 2020-04-30: 2 g via INTRAVENOUS

## 2020-04-30 MED ORDER — DEXAMETHASONE SODIUM PHOSPHATE 4 MG/ML IJ SOLN
INTRAMUSCULAR | Status: DC | PRN
  Administered 2020-04-30: 10 mg via INTRAVENOUS

## 2020-04-30 MED ORDER — LIDOCAINE HCL (PF) 2 % IJ SOLN
INTRAMUSCULAR | Status: AC
  Filled 2020-04-30: qty 5

## 2020-04-30 MED ORDER — CELECOXIB 200 MG PO CAPS
ORAL_CAPSULE | ORAL | Status: AC
  Filled 2020-04-30: qty 2

## 2020-04-30 MED ORDER — GABAPENTIN 300 MG PO CAPS
ORAL_CAPSULE | ORAL | Status: AC
  Filled 2020-04-30: qty 1

## 2020-04-30 MED ORDER — BUPIVACAINE HCL 0.25 % IJ SOLN
INTRAMUSCULAR | Status: DC | PRN
  Administered 2020-04-30: 16 mL

## 2020-04-30 MED ORDER — PROPOFOL 10 MG/ML IV BOLUS
INTRAVENOUS | Status: DC | PRN
  Administered 2020-04-30: 150 mg via INTRAVENOUS

## 2020-04-30 MED ORDER — FENTANYL CITRATE (PF) 100 MCG/2ML IJ SOLN: 25.0000 ug | INTRAMUSCULAR | Status: DC | PRN

## 2020-04-30 MED ORDER — FENTANYL CITRATE (PF) 100 MCG/2ML IJ SOLN
INTRAMUSCULAR | Status: AC
  Filled 2020-04-30: qty 2

## 2020-04-30 MED ORDER — ONDANSETRON HCL 4 MG/2ML IJ SOLN
INTRAMUSCULAR | Status: AC
  Filled 2020-04-30: qty 2

## 2020-04-30 MED ORDER — ACETAMINOPHEN 500 MG PO TABS: 1000.0000 mg | ORAL_TABLET | Freq: Four times a day (QID) | ORAL | 0 refills | Status: AC | PRN

## 2020-04-30 MED ORDER — SUGAMMADEX SODIUM 200 MG/2ML IV SOLN
INTRAVENOUS | Status: DC | PRN
  Administered 2020-04-30: 100 mg via INTRAVENOUS
  Administered 2020-04-30: 200 mg via INTRAVENOUS

## 2020-04-30 MED ORDER — HYDROCODONE-ACETAMINOPHEN 5-325 MG PO TABS: 1.0000 | ORAL_TABLET | ORAL | 0 refills | Status: AC | PRN

## 2020-04-30 MED ORDER — CEFAZOLIN SODIUM-DEXTROSE 2-4 GM/100ML-% IV SOLN
INTRAVENOUS | Status: AC
  Filled 2020-04-30: qty 100

## 2020-04-30 MED ORDER — CELECOXIB 200 MG PO CAPS
400.0000 mg | ORAL_CAPSULE | ORAL | Status: AC
  Administered 2020-04-30: 400 mg via ORAL

## 2020-04-30 MED ORDER — ROCURONIUM BROMIDE 10 MG/ML (PF) SYRINGE
PREFILLED_SYRINGE | INTRAVENOUS | Status: AC
  Filled 2020-04-30: qty 10

## 2020-04-30 MED ORDER — ROCURONIUM BROMIDE 100 MG/10ML IV SOLN
INTRAVENOUS | Status: DC | PRN
  Administered 2020-04-30: 80 mg via INTRAVENOUS

## 2020-04-30 MED ORDER — IBUPROFEN 800 MG PO TABS: 800.0000 mg | ORAL_TABLET | Freq: Three times a day (TID) | ORAL | 0 refills | Status: AC

## 2020-04-30 MED ORDER — ACETAMINOPHEN 500 MG PO TABS
ORAL_TABLET | ORAL | Status: AC
  Filled 2020-04-30: qty 2

## 2020-04-30 MED ORDER — GABAPENTIN 300 MG PO CAPS
300.0000 mg | ORAL_CAPSULE | ORAL | Status: AC
  Administered 2020-04-30: 300 mg via ORAL

## 2020-04-30 MED ORDER — ONDANSETRON HCL 4 MG/2ML IJ SOLN
INTRAMUSCULAR | Status: DC | PRN
  Administered 2020-04-30: 4 mg via INTRAVENOUS

## 2020-04-30 SURGICAL SUPPLY — 65 items
ADH SKN CLS APL DERMABOND .7 (GAUZE/BANDAGES/DRESSINGS) ×1
APL PRP STRL LF DISP 70% ISPRP (MISCELLANEOUS) ×1
APL SKNCLS STERI-STRIP NONHPOA (GAUZE/BANDAGES/DRESSINGS)
BENZOIN TINCTURE PRP APPL 2/3 (GAUZE/BANDAGES/DRESSINGS) IMPLANT
BLADE CLIPPER SENSICLIP SURGIC (BLADE) IMPLANT
BLADE SURG 15 STRL LF DISP TIS (BLADE) ×1 IMPLANT
BLADE SURG 15 STRL SS (BLADE) ×2
CANISTER SUCT 3000ML PPV (MISCELLANEOUS) IMPLANT
CHLORAPREP W/TINT 26 (MISCELLANEOUS) ×2 IMPLANT
COVER BACK TABLE 60X90IN (DRAPES) ×2 IMPLANT
COVER MAYO STAND STRL (DRAPES) ×2 IMPLANT
COVER SURGICAL LIGHT HANDLE (MISCELLANEOUS) ×2 IMPLANT
COVER WAND RF STERILE (DRAPES) ×2 IMPLANT
DECANTER SPIKE VIAL GLASS SM (MISCELLANEOUS) IMPLANT
DERMABOND ADVANCED (GAUZE/BANDAGES/DRESSINGS) ×1
DERMABOND ADVANCED .7 DNX12 (GAUZE/BANDAGES/DRESSINGS) ×1 IMPLANT
DRAPE LAPAROTOMY 100X72 PEDS (DRAPES) ×2 IMPLANT
DRAPE UTILITY XL STRL (DRAPES) ×2 IMPLANT
DRAPE WARM FLUID 44X44 (DRAPES) ×2 IMPLANT
DRSG TEGADERM 4X4.75 (GAUZE/BANDAGES/DRESSINGS) IMPLANT
ELECT COATED BLADE 2.86 ST (ELECTRODE) ×2 IMPLANT
ELECT REM PT RETURN 9FT ADLT (ELECTROSURGICAL) ×2
ELECTRODE REM PT RTRN 9FT ADLT (ELECTROSURGICAL) ×1 IMPLANT
GLOVE BIO SURGEON STRL SZ7.5 (GLOVE) ×2 IMPLANT
GLOVE BIOGEL PI IND STRL 7.5 (GLOVE) ×1 IMPLANT
GLOVE BIOGEL PI IND STRL 8 (GLOVE) ×1 IMPLANT
GLOVE BIOGEL PI INDICATOR 7.5 (GLOVE) ×1
GLOVE BIOGEL PI INDICATOR 8 (GLOVE) ×1
GLOVE SURG ENC MOIS LTX SZ7 (GLOVE) ×2 IMPLANT
GOWN STRL REUS W/ TWL LRG LVL3 (GOWN DISPOSABLE) ×2 IMPLANT
GOWN STRL REUS W/ TWL XL LVL3 (GOWN DISPOSABLE) ×1 IMPLANT
GOWN STRL REUS W/TWL LRG LVL3 (GOWN DISPOSABLE) ×4
GOWN STRL REUS W/TWL XL LVL3 (GOWN DISPOSABLE) ×2
KIT TURNOVER CYSTO (KITS) ×2 IMPLANT
NDL INSUFFLATION 14GA 120MM (NEEDLE) ×1 IMPLANT
NEEDLE HYPO 22GX1.5 SAFETY (NEEDLE) ×2 IMPLANT
NEEDLE INSUFFLATION 14GA 120MM (NEEDLE) ×2 IMPLANT
NS IRRIG 1000ML POUR BTL (IV SOLUTION) ×2 IMPLANT
PACK BASIN DAY SURGERY FS (CUSTOM PROCEDURE TRAY) ×2 IMPLANT
PAD ARMBOARD 7.5X6 YLW CONV (MISCELLANEOUS) ×4 IMPLANT
PENCIL SMOKE EVACUATOR (MISCELLANEOUS) ×2 IMPLANT
SCISSORS LAP 5X35 DISP (ENDOMECHANICALS) ×1 IMPLANT
SET IRRIG TUBING LAPAROSCOPIC (IRRIGATION / IRRIGATOR) IMPLANT
SET TUBE SMOKE EVAC HIGH FLOW (TUBING) ×2 IMPLANT
SLEEVE ENDOPATH XCEL 5M (ENDOMECHANICALS) ×2 IMPLANT
SLEEVE SCD COMPRESS KNEE MED (MISCELLANEOUS) IMPLANT
SPONGE LAP 4X18 RFD (DISPOSABLE) ×2 IMPLANT
STRIP CLOSURE SKIN 1/2X4 (GAUZE/BANDAGES/DRESSINGS) IMPLANT
SUT ETHIBOND 0 MO6 C/R (SUTURE) IMPLANT
SUT MNCRL AB 4-0 PS2 18 (SUTURE) ×2 IMPLANT
SUT NOVA NAB DX-16 0-1 5-0 T12 (SUTURE) ×1 IMPLANT
SUT VIC AB 0 SH 27 (SUTURE) IMPLANT
SUT VIC AB 2-0 SH 27 (SUTURE) ×2
SUT VIC AB 2-0 SH 27XBRD (SUTURE) IMPLANT
SUT VIC AB 3-0 SH 27 (SUTURE) ×2
SUT VIC AB 3-0 SH 27X BRD (SUTURE) ×1 IMPLANT
SUT VICRYL AB 3 0 TIES (SUTURE) IMPLANT
SYR CONTROL 10ML LL (SYRINGE) ×2 IMPLANT
TOWEL OR 17X26 10 PK STRL BLUE (TOWEL DISPOSABLE) ×2 IMPLANT
TRAY LAPAROSCOPIC (CUSTOM PROCEDURE TRAY) ×2 IMPLANT
TROCAR XCEL 12X100 BLDLESS (ENDOMECHANICALS) IMPLANT
TROCAR XCEL BLUNT TIP 100MML (ENDOMECHANICALS) IMPLANT
TROCAR XCEL NON-BLD 11X100MML (ENDOMECHANICALS) IMPLANT
TROCAR XCEL NON-BLD 5MMX100MML (ENDOMECHANICALS) ×2 IMPLANT
WARMER LAPAROSCOPE (MISCELLANEOUS) ×2 IMPLANT

## 2020-04-30 NOTE — Op Note (Signed)
Patient: [name] (May 25, 1965, [id number])  Date of Surgery: 04/30/2020   Preoperative Diagnosis: RIGHT GROIN PAIN, UMBILICAL HERNIA   Postoperative Diagnosis: RIGHT GROIN PAIN, UMBILICAL HERNIA   Surgical Procedure:  Umbilical hernia repair Laparoscopic lysis of adhesions  Operative Team Members:  Surgeon(s) and Role:    * Saralee Bolick, Hyman Hopes, MD - Primary   Anesthesiologist: Elmer Picker, MD CRNA: Jessica Priest, CRNA   Anesthesia: General   Fluids:  Total I/O In: -  Out: 5 [Blood:5]  Complications: none  Drains:  none   Specimen: None  Disposition:  PACU - hemodynamically stable.  Plan of Care: Discharge to home after PACU    Indications for Procedure: [name] is a 54 y.o. male who presented with right groin pain after previous hernia repair as well as an umbilical hernia.  [name] has had two previous open right inguinal hernia repairs in the early 90s.  He also had a left totally extraperitoneal hernia repair more recently at [facility].  He presented the office with right groin pain and an umbilical hernia.  I recommend we proceed with umbilical hernia repair with diagnostic laparoscopy.  The risk, benefits, and alternatives this procedure were discussed with the patient who agreed and consent to proceed.  Findings: Adhesions between the right colon and right abdominal sidewall and pelvis, lysed, no evidence of tacks or abnormalities in the area of the patient's pain.  No evidence of recurrent hernia in the right groin.  Description of Procedure: On the date stated above patient taken operating room suite placed supine position.  Ancef was given prior to the case start.  SCDs were placed in the lower extremities.  General endotracheal anesthesia was induced.  A timeout was completed verifying the correct patient, procedure, position, and equipment needed for the case.    I began by anesthetizing the previous infraumbilical incision site  with local anesthetic.  Then made an incision dissected down to the fascia the intra-abdominal wall.  I dissected out the umbilical stalk and detached umbilical stalk from the abdominal wall fascia.  A small umbilical hernia defect was identified.  A 5 mm trocar was placed bluntly through this defect in the abdomen was insufflated 15 mmHg.  The laparoscope was inserted and the abdomen the abdomen was inspected.  The patient's left groin appeared normal.  I was able to see the mesh in the preperitoneal plane which appeared to be aligned appropriately on the left side.  I inspected the underside of the abdominal wall on the patient's right side in the area of the patient's pain.  The patient's pain starts at the level of the umbilicus and this portion of the abdominal wall appears completely normal without any tacks or sutures or other abnormalities that would explain the pain in this region.  Lower in the groin near the previous hernia repairs there were adhesions between the right colon and the area of the previous hernia repair in the right pelvis.  A second 5 mm trocar was placed in the patient's left lower quadrant and I lysed these adhesions sharply.  I was able to mobilize the cecum off of the abdominal wall.  Notably there was no recurrent hernia in this area.  There is no visible mesh in this area.  There were no visible tacks or other problems in this area.  The appendix appeared normal.  At this point we desufflated the abdomen and direct our attention to closure of the umbilical hernia.  A #1 Novafil suture was used in a figure-of-eight fashion to close the umbilical hernia defect.  The umbilical stalk was tacked back down to the abdominal fascia with a 2-0 Vicryl suture.  A 2-0 Vicryl suture was used to reapproximate the deep dermal layer and then 4-0 Monocryl suture was used as a subcuticular closure.  Dermabond was applied to the incisions.  All sponge needle counts correct in this case.   [name], MD General, Bariatric, & Minimally Invasive Surgery [facility], [place]

## 2020-04-30 NOTE — Transfer of Care (Signed)
Immediate Anesthesia Transfer of Care Note  Patient: [name]  Procedure(s) Performed: Procedure(s) (LRB): LAPAROSCOPY DIAGNOSTIC (N/A) HERNIA REPAIR UMBILICAL ADULT (N/A)  Patient Location: PACU  Anesthesia Type: General  Level of Consciousness: awake, sedated, patient cooperative and responds to stimulation  Airway & Oxygen Therapy: Patient Spontanous Breathing and Patient connected to [place] 02 and soft FM   Post-op Assessment: Report given to PACU RN, Post -op Vital signs reviewed and stable and Patient moving all extremities  Post vital signs: Reviewed and stable  Complications: No apparent anesthesia complications

## 2020-04-30 NOTE — Anesthesia Procedure Notes (Signed)
Procedure Name: Intubation Date/Time: 04/30/2020 10:09 AM Performed by: Justice Rocher, CRNA Pre-anesthesia Checklist: Patient identified, Emergency Drugs available, Suction available, Patient being monitored and Timeout performed Patient Re-evaluated:Patient Re-evaluated prior to induction Oxygen Delivery Method: Circle system utilized Preoxygenation: Pre-oxygenation with 100% oxygen Induction Type: IV induction Ventilation: Mask ventilation without difficulty Laryngoscope Size: Mac and 4 Grade View: Grade II Tube type: Oral Tube size: 7.5 mm Number of attempts: 1 Airway Equipment and Method: Stylet and Oral airway Placement Confirmation: ETT inserted through vocal cords under direct vision,  positive ETCO2,  breath sounds checked- equal and bilateral and CO2 detector Secured at: 22 cm Tube secured with: Tape Dental Injury: Teeth and Oropharynx as per pre-operative assessment

## 2020-04-30 NOTE — Anesthesia Preprocedure Evaluation (Addendum)
Anesthesia Evaluation  Patient identified by MRN, date of birth, ID band Patient awake    Reviewed: Allergy & Precautions, NPO status , Patient's Chart, lab work & pertinent test results  Airway Mallampati: I  TM Distance: >3 FB Neck ROM: Full    Dental no notable dental hx.    Pulmonary neg pulmonary ROS, former smoker,    Pulmonary exam normal breath sounds clear to auscultation       Cardiovascular negative cardio ROS Normal cardiovascular exam Rhythm:Regular Rate:Normal     Neuro/Psych PSYCHIATRIC DISORDERS Anxiety negative neurological ROS     GI/Hepatic Neg liver ROS, GERD  Medicated and Controlled,  Endo/Other  negative endocrine ROS  Renal/GU negative Renal ROS  negative genitourinary   Musculoskeletal negative musculoskeletal ROS (+)   Abdominal   Peds  Hematology negative hematology ROS (+)   Anesthesia Other Findings   Reproductive/Obstetrics                            Anesthesia Physical Anesthesia Plan  ASA: II  Anesthesia Plan: General   Post-op Pain Management:    Induction: Intravenous  PONV Risk Score and Plan: 2 and Midazolam, Dexamethasone and Ondansetron  Airway Management Planned: Oral ETT  Additional Equipment:   Intra-op Plan:   Post-operative Plan: Extubation in OR  Informed Consent: I have reviewed the patients History and Physical, chart, labs and discussed the procedure including the risks, benefits and alternatives for the proposed anesthesia with the patient or authorized representative who has indicated his/her understanding and acceptance.     Dental advisory given  Plan Discussed with: CRNA  Anesthesia Plan Comments:         Anesthesia Quick Evaluation

## 2020-04-30 NOTE — Anesthesia Postprocedure Evaluation (Signed)
Anesthesia Post Note  Patient: [name]  Procedure(s) Performed: LAPAROSCOPY DIAGNOSTIC (N/A Abdomen) HERNIA REPAIR UMBILICAL ADULT (N/A Abdomen)     Patient location during evaluation: PACU Anesthesia Type: General Level of consciousness: awake and alert Pain management: pain level controlled Vital Signs Assessment: post-procedure vital signs reviewed and stable Respiratory status: spontaneous breathing, nonlabored ventilation, respiratory function stable and patient connected to nasal cannula oxygen Cardiovascular status: blood pressure returned to baseline and stable Postop Assessment: no apparent nausea or vomiting Anesthetic complications: no   No complications documented.  Last Vitals:  Vitals:   04/30/20 1130 04/30/20 1146  BP: (!) 135/91 124/87  Pulse: 99 92  Resp: (!) 21 (!) 22  Temp:    SpO2: 100% 95%    Last Pain:  Vitals:   04/30/20 1146  TempSrc:   PainSc: 4                  Latron Ribas L Khaniyah Bezek

## 2020-04-30 NOTE — Discharge Instructions (Signed)
Post Anesthesia Home Care Instructions  Activity: Get plenty of rest for the remainder of the day. A responsible individual must stay with you for 24 hours following the procedure.  For the next 24 hours, DO NOT: -Drive a car -Advertising copywriter -Drink alcoholic beverages -Take any medication unless instructed by your physician -Make any legal decisions or sign important papers.  Meals: Start with liquid foods such as gelatin or soup. Progress to regular foods as tolerated. Avoid greasy, spicy, heavy foods. If nausea and/or vomiting occur, drink only clear liquids until the nausea and/or vomiting subsides. Call your physician if vomiting continues.  Special Instructions/Symptoms: Your throat may feel dry or sore from the anesthesia or the breathing tube placed in your throat during surgery. If this causes discomfort, gargle with warm salt water. The discomfort should disappear within 24 hours.   Laparoscopic Hernia Repair, Care After This sheet gives you information about how to care for yourself after your procedure. Your health care provider may also give you more specific instructions. If you have problems or questions, contact your health care provider. What can I expect after the procedure? After the procedure, it is common to have:  Pain, discomfort, or soreness. Follow these instructions at home: Incision care   Follow instructions from your health care provider about how to take care of your incision. Make sure you: ? Wash your hands with soap and water before you change your bandage (dressing) or before you touch your abdomen. If soap and water are not available, use hand sanitizer. ? Change your dressing as told by your health care provider. ? Leave stitches (sutures), skin glue, or adhesive strips in place. These skin closures may need to stay in place for 2 weeks or longer. If adhesive strip edges start to loosen and curl up, you may trim the loose edges. Do not remove  adhesive strips completely unless your health care provider tells you to do that.  Check your incision area every day for signs of infection. Check for: ? Redness, swelling, or pain. ? Fluid or blood. ? Warmth. ? Pus or a bad smell. Bathing   Do not take baths, swim, or use a hot tub until your health care provider approves. Ask your health care provider if you can take showers. You may only be allowed to take sponge baths for bathing.  Keep your bandage (dressing) dry until your health care provider says it can be removed. Activity  Do not lift anything that is heavier than 10 lb (4.5 kg) until your health care provider approves.  Do not drive or use heavy machinery while taking prescription pain medicine. Ask your health care provider when it is safe for you to drive or use heavy machinery.  Do not drive for 24 hours if you were given a medicine to help you relax (sedative) during your procedure.  Rest as told by your health care provider. You may return to your normal activities when your health care provider approves. General instructions  Take over-the-counter and prescription medicines only as told by your health care provider.  To prevent or treat constipation while you are taking prescription pain medicine, your health care provider may recommend that you: ? Take over-the-counter or prescription medicines. ? Eat foods that are high in fiber, such as fresh fruits and vegetables, whole grains, and beans. ? Limit foods that are high in fat and processed sugars, such as fried and sweet foods.  Drink enough fluid to keep your urine clear or  pale yellow.  Hold a pillow over your abdomen when you cough or sneeze. This helps with pain.  Keep all follow-up visits as told by your health care provider. This is important. Contact a health care provider if:  You have: ? A fever or chills. ? Redness, swelling, or pain around your incision. ? Fluid or blood coming from your  incision. ? Pus or a bad smell coming from your incision. ? Pain that gets worse or does not get better with medicine. ? Nausea or vomiting. ? A cough. ? Shortness of breath.  Your incision feels warm to the touch.  You have not had a bowel movement in three days.  You are not able to urinate. Get help right away if:  You have severe pain in your abdomen.  You have persistent nausea and vomiting.  You have redness, warmth, or pain in your leg.  You have chest pain.  You have trouble breathing. Summary  After this procedure, it is common to have pain, discomfort, or soreness.  Follow instructions from your health care provider about how to take care of your incision.  Check your incision area every day for signs of infection. Report any signs of infection to your health care provider.  Keep all follow-up visits as told by your health care provider. This is important. This information is not intended to replace advice given to you by your health care provider. Make sure you discuss any questions you have with your health care provider. Document Revised: 04/03/2017 Document Reviewed: 12/12/2015 Elsevier Patient Education  2020 [employer].

## 2020-04-30 NOTE — H&P (Signed)
Admitting Physician: Hyman Hopes Rajanae Mantia  Service: General Surgery  CC: Umbilical hernia, right groin pain.  Subjective   HPI: [name] is an 54 y.o. male who is here for elective umbilical hernia repair with diagnostic laparoscopy.  He has a history of right inguinal hernia repair and has had pain in the right groin.  Today we will fix the umbilical hernia and look for any explanation of his right groin pain on diagnostic laparoscopy.  Past Medical History:  Diagnosis Date  . BPH (benign prostatic hyperplasia)   . Chronic groin pain, right   . Chronic prostatitis    non-bactrerial , followed by urologist,  takes elmiron  . Dermatitis, unspecified    chronic intermittant  . GAD (generalized anxiety disorder)   . GERD (gastroesophageal reflux disease)   . Hemorrhoids   . History of anal fissures   . History of chronic sinusitis   . History of kidney stones   . History of repair of hiatal hernia 2007  . Lower urinary tract symptoms (LUTS)   . Umbilical hernia   . Wears contact lenses     Past Surgical History:  Procedure Laterality Date  . INGUINAL HERNIA REPAIR Right 1992;  1998  . LAPAROSCOPIC CHOLECYSTECTOMY  06-30-2001   @[email or web address]   . LAPAROSCOPIC INGUINAL HERNIA REPAIR Left 03-31-2018  [email or web address]   . LAPAROSCOPIC NISSEN FUNDOPLICATION  02-27-2006   @[email or web address]   . LIPOMA EXCISION  2012    Family History  Problem Relation Age of Onset  . Cancer Father        thyroid and prostate    Social:  reports that he quit smoking about 8 years ago. His smoking use included cigarettes. He quit after 20.00 years of use. He has never used smokeless tobacco. He reports previous alcohol use. He reports that he does not use drugs.  Allergies: No Known Allergies  Medications: Current Outpatient Medications  Medication Instructions  . fexofenadine (ALLEGRA) 180 mg, Oral, Daily  . lamoTRIgine (LAMICTAL) 150 mg, Oral, Daily  . omeprazole-sodium bicarbonate (ZEGERID) 40-1100 MG capsule 1  capsule, Oral, 2 times daily  . oxcarbazepine (TRILEPTAL) 600-1,800 mg, Oral, 2 times daily, Takes on tablet am and two tablet pm  . pentosan polysulfate (ELMIRON) 100 mg, Oral, 3 times daily before meals    ROS - all of the below systems have been reviewed with the patient and positives are indicated with bold text General: chills, fever or night sweats Eyes: blurry vision or double vision ENT: epistaxis or sore throat Allergy/Immunology: itchy/watery eyes or nasal congestion Hematologic/Lymphatic: bleeding problems, blood clots or swollen lymph nodes Endocrine: temperature intolerance or unexpected weight changes Breast: new or changing breast lumps or nipple discharge Resp: cough, shortness of breath, or wheezing CV: chest pain or dyspnea on exertion GI: as per HPI GU: dysuria, trouble voiding, or hematuria MSK: joint pain or joint stiffness Neuro: TIA or stroke symptoms Derm: pruritus and skin lesion changes Psych: anxiety and depression  Objective   PE Blood pressure (!) 142/95, pulse 78, temperature 97.7 F (36.5 C), temperature source Oral, resp. rate 15, height 6\' 1"  (1.854 m), weight 94.1 kg, SpO2 98 %. Constitutional: NAD; conversant; no deformities Eyes: Moist conjunctiva; no lid lag; anicteric; PERRL Neck: Trachea midline; no thyromegaly Lungs: Normal respiratory effort; no tactile fremitus CV: RRR; no palpable thrills; no pitting edema GI: Abd Soft, RLQ tenderness marked in preoperative area, umbilical hernia marked in preoperative area; no palpable hepatosplenomegaly MSK: Normal range of motion of extremities; no clubbing/cyanosis  Psychiatric: Appropriate affect; alert and oriented x3 Lymphatic: No palpable cervical or axillary lymphadenopathy  No results found for this or any previous visit (from the past 24 hour(s)).  Imaging Orders  No imaging studies ordered today     Assessment and Plan   [name] is an 54 y.o. male who is here for elective  umbilical hernia repair with diagnostic laparoscopy.  He has a history of right inguinal hernia repair and has had pain in the right groin.  Today we will fix the umbilical hernia and look for any explanation of his right groin pain on diagnostic laparoscopy.  The risks, benefits and alternatives of this procedure have been discussed with the patient.  The risks discussed included but were not limited to the risk of infection, bleeding, damage nearby structures. chronic pain, and recurrent hernia.  With this discussion complete all questions answered the patient granted consent to proceed.  [name], M.D. General, Bariatric and Minimally Invasive Surgery  [facility] Use [email or web address] to contact on call provider

## 2020-05-01 ENCOUNTER — Encounter (HOSPITAL_BASED_OUTPATIENT_CLINIC_OR_DEPARTMENT_OTHER): Payer: Self-pay | Admitting: Surgery

## 2020-07-12 DIAGNOSIS — F3181 Bipolar II disorder: Secondary | ICD-10-CM | POA: Diagnosis not present

## 2020-08-01 DIAGNOSIS — M9902 Segmental and somatic dysfunction of thoracic region: Secondary | ICD-10-CM | POA: Diagnosis not present

## 2020-08-01 DIAGNOSIS — Z1152 Encounter for screening for COVID-19: Secondary | ICD-10-CM | POA: Diagnosis not present

## 2020-08-01 DIAGNOSIS — M9904 Segmental and somatic dysfunction of sacral region: Secondary | ICD-10-CM | POA: Diagnosis not present

## 2020-08-01 DIAGNOSIS — M9903 Segmental and somatic dysfunction of lumbar region: Secondary | ICD-10-CM | POA: Diagnosis not present

## 2020-08-01 DIAGNOSIS — M5386 Other specified dorsopathies, lumbar region: Secondary | ICD-10-CM | POA: Diagnosis not present

## 2020-10-19 DIAGNOSIS — Z125 Encounter for screening for malignant neoplasm of prostate: Secondary | ICD-10-CM | POA: Diagnosis not present

## 2020-10-19 DIAGNOSIS — Z0000 Encounter for general adult medical examination without abnormal findings: Secondary | ICD-10-CM | POA: Diagnosis not present

## 2020-10-19 DIAGNOSIS — Z13220 Encounter for screening for lipoid disorders: Secondary | ICD-10-CM | POA: Diagnosis not present

## 2020-10-24 DIAGNOSIS — N411 Chronic prostatitis: Secondary | ICD-10-CM | POA: Diagnosis not present

## 2020-10-24 DIAGNOSIS — Z23 Encounter for immunization: Secondary | ICD-10-CM | POA: Diagnosis not present

## 2020-10-24 DIAGNOSIS — K219 Gastro-esophageal reflux disease without esophagitis: Secondary | ICD-10-CM | POA: Diagnosis not present

## 2020-10-24 DIAGNOSIS — L719 Rosacea, unspecified: Secondary | ICD-10-CM | POA: Diagnosis not present

## 2020-10-24 DIAGNOSIS — L409 Psoriasis, unspecified: Secondary | ICD-10-CM | POA: Diagnosis not present

## 2020-10-24 DIAGNOSIS — Z0000 Encounter for general adult medical examination without abnormal findings: Secondary | ICD-10-CM | POA: Diagnosis not present

## 2020-10-31 DIAGNOSIS — F3181 Bipolar II disorder: Secondary | ICD-10-CM | POA: Diagnosis not present

## 2020-11-09 DIAGNOSIS — M9903 Segmental and somatic dysfunction of lumbar region: Secondary | ICD-10-CM | POA: Diagnosis not present

## 2020-11-09 DIAGNOSIS — M5386 Other specified dorsopathies, lumbar region: Secondary | ICD-10-CM | POA: Diagnosis not present

## 2020-11-09 DIAGNOSIS — M9904 Segmental and somatic dysfunction of sacral region: Secondary | ICD-10-CM | POA: Diagnosis not present

## 2020-11-09 DIAGNOSIS — M9902 Segmental and somatic dysfunction of thoracic region: Secondary | ICD-10-CM | POA: Diagnosis not present

## 2020-12-06 DIAGNOSIS — K068 Other specified disorders of gingiva and edentulous alveolar ridge: Secondary | ICD-10-CM | POA: Diagnosis not present

## 2021-01-31 DIAGNOSIS — F3181 Bipolar II disorder: Secondary | ICD-10-CM | POA: Diagnosis not present

## 2021-04-22 ENCOUNTER — Emergency Department
Admission: EM | Admit: 2021-04-22 | Discharge: 2021-04-22 | Disposition: A | Discharge status: Home / Self Care | Payer: BC Managed Care – PPO | Source: Home / Self Care

## 2021-04-22 ENCOUNTER — Emergency Department: Admit: 2021-04-22 | Discharge status: Absent without leave | Payer: Self-pay

## 2021-04-22 DIAGNOSIS — N390 Urinary tract infection, site not specified: Secondary | ICD-10-CM

## 2021-04-22 LAB — POCT URINALYSIS DIP (MANUAL ENTRY)
Blood, UA: NEGATIVE
Glucose, UA: NEGATIVE mg/dL
Ketones, POC UA: NEGATIVE mg/dL
Leukocytes, UA: NEGATIVE
Nitrite, UA: NEGATIVE
Spec Grav, UA: 1.025 (ref 1.010–1.025)
Urobilinogen, UA: 0.2 E.U./dL
pH, UA: 7 (ref 5.0–8.0)

## 2021-04-22 MED ORDER — SULFAMETHOXAZOLE-TRIMETHOPRIM 800-160 MG PO TABS: 1.0000 | ORAL_TABLET | Freq: Two times a day (BID) | ORAL | 0 refills | Status: AC

## 2021-04-22 NOTE — Discharge Instructions (Addendum)
Suspect acute prostatitis. Take antibiotics as prescribed. Keep hydrated. We are sending urine to the lab for a culture and will contact you in a few days with results. If persistent symptoms not improved after a few days of antibiotics, recommend follow-up with PCP or urologist.

## 2021-04-22 NOTE — ED Triage Notes (Signed)
Pt presents with abdominal pain, back pain, nausea,prostate fullness and pain in his urethra for 4-5 days

## 2021-04-22 NOTE — ED Provider Notes (Signed)
[name]    CSN: [id number] Arrival date & time: 04/22/21  0846      History   Chief Complaint Chief Complaint  Patient presents with   Abdominal Pain   Back Pain    HPI [name] is a 55 y.o. male.   Patient presents with concerns of possible UTI. He reports dysuria for the past 4-5 days as well as lower abdominal discomfort, low back pain, the feeling of prostate fullness, and some nausea. The patient denies known discharge, frequency, or urgency or any fever. The patient has chronic prostatitis, managed with Elmiron, and states this feels somewhat similar but also different. He denies any possibility of STI. The patient has not taken or tried anything for his symptoms.  The history is provided by the patient.  Abdominal Pain Associated symptoms: dysuria and nausea   Associated symptoms: no constipation, no diarrhea, no fatigue, no fever, no hematuria, no shortness of breath and no vomiting   Back Pain Associated symptoms: abdominal pain and dysuria   Associated symptoms: no fever and no headaches    Past Medical History:  Diagnosis Date   BPH (benign prostatic hyperplasia)    Chronic groin pain, right    Chronic prostatitis    non-bactrerial , followed by urologist,  takes elmiron   Dermatitis, unspecified    chronic intermittant   GAD (generalized anxiety disorder)    GERD (gastroesophageal reflux disease)    Hemorrhoids    History of anal fissures    History of chronic sinusitis    History of kidney stones    History of repair of hiatal hernia 2007   Lower urinary tract symptoms (LUTS)    Umbilical hernia    Wears contact lenses     Patient Active Problem List   Diagnosis Date Noted   Thrombosed external hemorrhoid 12/27/2012    Past Surgical History:  Procedure Laterality Date   INGUINAL HERNIA REPAIR Right 1992;  1998   LAPAROSCOPIC CHOLECYSTECTOMY  06-30-2001   @[facility]    LAPAROSCOPIC INGUINAL HERNIA REPAIR Left 03-31-2018  @[facility]     LAPAROSCOPIC NISSEN FUNDOPLICATION  02-27-2006   @[facility]    LAPAROSCOPY N/A 04/30/2020   Procedure: LAPAROSCOPY DIAGNOSTIC;  Surgeon: 03-01-2006, MD;  Location: [facility];  Service: General;  Laterality: N/A;   LIPOMA EXCISION  2012   UMBILICAL HERNIA REPAIR N/A 04/30/2020   Procedure: HERNIA REPAIR UMBILICAL ADULT;  Surgeon: 05/02/2020, Quentin Ore, MD;  Location: [facility];  Service: General;  Laterality: N/A;       Home Medications    Prior to Admission medications   Medication Sig Start Date End Date Taking? Authorizing Provider  sulfamethoxazole-trimethoprim (BACTRIM DS) 800-160 MG tablet Take 1 tablet by mouth 2 (two) times daily for 14 days. 04/22/21 05/06/21 Yes Cimone Fahey L, PA  acetaminophen (TYLENOL) 500 MG tablet Take 2 tablets (1,000 mg total) by mouth every 6 (six) hours as needed. 04/30/20 04/30/21  Stechschulte, 07/04/21, MD  fexofenadine (ALLEGRA) 180 MG tablet Take 180 mg by mouth daily.    [provider]  HYDROcodone-acetaminophen (NORCO/VICODIN) 5-325 MG tablet Take 1 tablet by mouth every 4 (four) hours as needed for severe pain. Patient not taking: Reported on 04/22/2021 04/30/20 04/30/21  Stechschulte, 04/24/2021, MD  lamoTRIgine (LAMICTAL) 150 MG tablet Take 150 mg by mouth daily.    [provider]  omeprazole-sodium bicarbonate (ZEGERID) 40-1100 MG capsule Take 1 capsule by mouth 2 (two) times daily.  [provider]  oxcarbazepine (TRILEPTAL) 600 MG tablet Take 600-1,800 mg by mouth 2 (two) times daily. Takes on tablet am and two tablet pm    [provider]  pentosan polysulfate (ELMIRON) 100 MG capsule Take 100 mg by mouth 3 (three) times daily before meals.    [provider]    Family History Family History  Problem Relation Age of Onset   Cancer Father        thyroid and prostate    Social History Social History   Tobacco Use   Smoking status: Former    Years: 20.00     Types: Cigarettes    Quit date: 05/06/2011    Years since quitting: 9.9   Smokeless tobacco: Never  Vaping Use   Vaping Use: Never used  Substance Use Topics   Alcohol use: Not Currently    Comment: rare   Drug use: Never     Allergies   Patient has no known allergies.   Review of Systems Review of Systems  Constitutional:  Negative for fatigue and fever.  Respiratory:  Negative for shortness of breath.   Gastrointestinal:  Positive for abdominal pain and nausea. Negative for constipation, diarrhea and vomiting.  Genitourinary:  Positive for dysuria. Negative for difficulty urinating, frequency, genital sores, hematuria, penile discharge and urgency.  Musculoskeletal:  Positive for back pain.  Skin:  Negative for rash.  Neurological:  Negative for headaches.    Physical Exam Triage Vital Signs ED Triage Vitals  Enc Vitals Group     BP 04/22/21 0853 137/90     Pulse Rate 04/22/21 0853 76     Resp 04/22/21 0853 14     Temp 04/22/21 0853 97.8 F (36.6 C)     Temp Source 04/22/21 0853 Oral     SpO2 04/22/21 0853 97 %     Weight --      Height --      Head Circumference --      Peak Flow --      Pain Score 04/22/21 0855 1     Pain Loc --      Pain Edu? --      Excl. in GC? --    No data found.  Updated Vital Signs BP 137/90 (BP Location: Right Arm)    Pulse 76    Temp 97.8 F (36.6 C) (Oral)    Resp 14    SpO2 97%   Visual Acuity Right Eye Distance:   Left Eye Distance:   Bilateral Distance:    Right Eye Near:   Left Eye Near:    Bilateral Near:     Physical Exam Vitals and nursing note reviewed.  Constitutional:      General: He is not in acute distress. Eyes:     Pupils: Pupils are equal, round, and reactive to light.  Cardiovascular:     Rate and Rhythm: Normal rate and regular rhythm.     Heart sounds: Normal heart sounds.  Pulmonary:     Effort: Pulmonary effort is normal.     Breath sounds: Normal breath sounds.  Abdominal:     Tenderness:  There is abdominal tenderness (mild) in the suprapubic area. There is no right CVA tenderness or left CVA tenderness.  Genitourinary:    Comments: Pt declined Neurological:     Mental Status: He is alert.     Gait: Gait normal.  Psychiatric:        Mood and Affect: Mood normal.  UC Treatments / Results  Labs (all labs ordered are listed, but only abnormal results are displayed) Labs Reviewed  POCT URINALYSIS DIP (MANUAL ENTRY) - Abnormal; Notable for the following components:      Result Value   Bilirubin, UA small (*)    Protein Ur, POC trace (*)    All other components within normal limits  URINE CULTURE    EKG   Radiology No results found.  Procedures Procedures (including critical care time)  Medications Ordered in UC Medications - No data to display  Initial Impression / Assessment and Plan / UC Course  I have reviewed the triage vital signs and the nursing notes.  Pertinent labs & imaging results that were available during my care of the patient were reviewed by me and considered in my medical decision making (see chart for details).     Sx most consistent with acute prostatitis. Neg UA in clinic. Empiric tx with Bactrim. Ucx send out - pt declined STI testing. Discussed return precautions and follow-up with urology.   E/M: 1 acute uncomplicated illness, 2 data (UA, Ucx), moderate risk due to prescription management  Final Clinical Impressions(s) / UC Diagnoses   Final diagnoses:  Lower urinary tract infectious disease     Discharge Instructions      Suspect acute prostatitis. Take antibiotics as prescribed. Keep hydrated. We are sending urine to the lab for a culture and will contact you in a few days with results. If persistent symptoms not improved after a few days of antibiotics, recommend follow-up with PCP or urologist.     ED Prescriptions     Medication Sig Dispense Auth. Provider   sulfamethoxazole-trimethoprim (BACTRIM DS) 800-160 MG  tablet Take 1 tablet by mouth 2 (two) times daily for 14 days. 28 tablet Abner Greenspan, Danaysha Kirn L, PA      PDMP not reviewed this encounter.   Delsa Sale, [place] 04/22/21 [phone]

## 2021-04-23 LAB — URINE CULTURE
MICRO NUMBER:: 12773935
Result:: NO GROWTH
SPECIMEN QUALITY:: ADEQUATE

## 2021-05-03 ENCOUNTER — Emergency Department: Admit: 2021-05-03 | Discharge status: Absent without leave | Payer: Self-pay

## 2021-05-03 ENCOUNTER — Other Ambulatory Visit: Payer: Self-pay

## 2021-05-03 ENCOUNTER — Emergency Department
Admission: EM | Admit: 2021-05-03 | Discharge: 2021-05-03 | Disposition: A | Discharge status: Home / Self Care | Payer: BC Managed Care – PPO | Source: Home / Self Care

## 2021-05-03 DIAGNOSIS — J029 Acute pharyngitis, unspecified: Secondary | ICD-10-CM

## 2021-05-03 LAB — POC SARS CORONAVIRUS 2 AG -  ED: SARS Coronavirus 2 Ag: NEGATIVE

## 2021-05-03 LAB — POCT RAPID STREP A (OFFICE): Rapid Strep A Screen: NEGATIVE

## 2021-05-03 MED ORDER — AMOXICILLIN-POT CLAVULANATE 875-125 MG PO TABS: 1.0000 | ORAL_TABLET | Freq: Two times a day (BID) | ORAL | 0 refills | Status: DC

## 2021-05-03 MED ORDER — PREDNISONE 20 MG PO TABS: ORAL_TABLET | ORAL | 0 refills | Status: DC

## 2021-05-03 NOTE — Discharge Instructions (Addendum)
Advised patient rapid strep and COVID-19 were negative this morning.  Advised patient to take medication as directed with food to completion.  Advised patient to take Prednisone with first dose of Augmentin for the next 5 of 7 days.  Encouraged patient to increase daily water intake while taking these medications.

## 2021-05-03 NOTE — ED Triage Notes (Signed)
Pt c/o sore throat x 4 days. Also c/o headache. Coughing up green mucous. Denies fever. COVID test neg at home last night. Taking tylenol cold and flu prn.

## 2021-05-03 NOTE — ED Provider Notes (Signed)
[name]    CSN: [id number] Arrival date & time: 05/03/21  5852      History   Chief Complaint Chief Complaint  Patient presents with   Sore Throat    HPI [name] is a 55 y.o. male.   HPI 55 year old male presents with sore throat, headache and coughing up green mucus for 4 days.  Reports COVID-19 home test was negative last night.  Reports taking Tylenol Cold and flu with little to no relief.  PMH significant for chronic sinusitis.  Past Medical History:  Diagnosis Date   BPH (benign prostatic hyperplasia)    Chronic groin pain, right    Chronic prostatitis    non-bactrerial , followed by urologist,  takes elmiron   Dermatitis, unspecified    chronic intermittant   GAD (generalized anxiety disorder)    GERD (gastroesophageal reflux disease)    Hemorrhoids    History of anal fissures    History of chronic sinusitis    History of kidney stones    History of repair of hiatal hernia 2007   Lower urinary tract symptoms (LUTS)    Umbilical hernia    Wears contact lenses     Patient Active Problem List   Diagnosis Date Noted   Thrombosed external hemorrhoid 12/27/2012    Past Surgical History:  Procedure Laterality Date   INGUINAL HERNIA REPAIR Right 1992;  1998   LAPAROSCOPIC CHOLECYSTECTOMY  06-30-2001   @[facility]    LAPAROSCOPIC INGUINAL HERNIA REPAIR Left 03-31-2018  @[facility]    LAPAROSCOPIC NISSEN FUNDOPLICATION  02-27-2006   @[facility]    LAPAROSCOPY N/A 04/30/2020   Procedure: LAPAROSCOPY DIAGNOSTIC;  Surgeon: 03-01-2006, MD;  Location: [facility];  Service: General;  Laterality: N/A;   LIPOMA EXCISION  2012   UMBILICAL HERNIA REPAIR N/A 04/30/2020   Procedure: HERNIA REPAIR UMBILICAL ADULT;  Surgeon: 05/02/2020, Quentin Ore, MD;  Location: [facility];  Service: General;  Laterality: N/A;       Home Medications    Prior to Admission medications   Medication Sig Start Date End Date Taking? Authorizing  Provider  amoxicillin-clavulanate (AUGMENTIN) 875-125 MG tablet Take 1 tablet by mouth every 12 (twelve) hours. 05/03/21  Yes Hyman Hopes, FNP  predniSONE (DELTASONE) 20 MG tablet Take 3 tabs PO daily x 5 days. 05/03/21  Yes 05/05/21, FNP  fexofenadine (ALLEGRA) 180 MG tablet Take 180 mg by mouth daily.    [provider]  lamoTRIgine (LAMICTAL) 150 MG tablet Take 150 mg by mouth daily.    [provider]  omeprazole-sodium bicarbonate (ZEGERID) 40-1100 MG capsule Take 1 capsule by mouth 2 (two) times daily.    [provider]  oxcarbazepine (TRILEPTAL) 600 MG tablet Take 600-1,800 mg by mouth 2 (two) times daily. Takes on tablet am and two tablet pm    [provider]  pentosan polysulfate (ELMIRON) 100 MG capsule Take 100 mg by mouth 3 (three) times daily before meals.    [provider]  sulfamethoxazole-trimethoprim (BACTRIM DS) 800-160 MG tablet Take 1 tablet by mouth 2 (two) times daily for 14 days. 04/22/21 05/06/21  Trevor Iha, PA    Family History Family History  Problem Relation Age of Onset   Cancer Father        thyroid and prostate    Social History Social History   Tobacco Use   Smoking status: Former    Years: 20.00    Types: Cigarettes    Quit date: 05/06/2011  Years since quitting: 10.0   Smokeless tobacco: Never  Vaping Use   Vaping Use: Never used  Substance Use Topics   Alcohol use: Not Currently    Comment: rare   Drug use: Never     Allergies   Patient has no known allergies.   Review of Systems Review of Systems  HENT:  Positive for congestion and sore throat.   Neurological:  Positive for headaches.  All other systems reviewed and are negative.   Physical Exam Triage Vital Signs ED Triage Vitals  Enc Vitals Group     BP 05/03/21 0843 (!) 139/92     Pulse Rate 05/03/21 0843 91     Resp 05/03/21 0843 16     Temp 05/03/21 0843 98 F (36.7 C)     Temp Source 05/03/21 0843 Oral     SpO2  05/03/21 0843 98 %     Weight --      Height --      Head Circumference --      Peak Flow --      Pain Score 05/03/21 0845 8     Pain Loc --      Pain Edu? --      Excl. in GC? --    No data found.  Updated Vital Signs BP (!) 139/92 (BP Location: Left Arm)    Pulse 91    Temp 98 F (36.7 C) (Oral)    Resp 16    SpO2 98%   Physical Exam Vitals and nursing note reviewed.  Constitutional:      General: He is not in acute distress.    Appearance: Normal appearance. He is normal weight. He is not ill-appearing.  HENT:     Head: Normocephalic and atraumatic.     Right Ear: Tympanic membrane and external ear normal.     Left Ear: Tympanic membrane and external ear normal.     Ears:     Comments: Moderate eustachian tube dysfunction noted bilaterally    Mouth/Throat:     Mouth: Mucous membranes are moist.     Pharynx: Oropharynx is clear. Uvula midline. Posterior oropharyngeal erythema and uvula swelling present.  Eyes:     Extraocular Movements: Extraocular movements intact.     Conjunctiva/sclera: Conjunctivae normal.     Pupils: Pupils are equal, round, and reactive to light.  Cardiovascular:     Pulses: Normal pulses.     Heart sounds: Normal heart sounds.  Pulmonary:     Effort: Pulmonary effort is normal.     Breath sounds: Normal breath sounds.  Musculoskeletal:     Cervical back: Normal range of motion and neck supple. No tenderness.  Lymphadenopathy:     Cervical: No cervical adenopathy.  Skin:    General: Skin is warm and dry.  Neurological:     General: No focal deficit present.     Mental Status: He is alert and oriented to person, place, and time.     UC Treatments / Results  Labs (all labs ordered are listed, but only abnormal results are displayed) Labs Reviewed  POCT RAPID STREP A (OFFICE)  POC SARS CORONAVIRUS 2 AG -  ED    EKG   Radiology No results found.  Procedures Procedures (including critical care time)  Medications Ordered in  UC Medications - No data to display  Initial Impression / Assessment and Plan / UC Course  I have reviewed the triage vital signs and the nursing notes.  Pertinent labs &  imaging results that were available during my care of the patient were reviewed by me and considered in my medical decision making (see chart for details).     MDM: 1.  Acute pharyngitis-Augmentin and Prednisone. Advised patient to take medication as directed with food to completion.  Advised patient to take Prednisone with first dose of Augmentin for the next 5 of 7 days.  Encouraged patient to increase daily water intake while taking these medications.  Final Clinical Impressions(s) / UC Diagnoses   Final diagnoses:  Acute pharyngitis, unspecified etiology     Discharge Instructions      Advised patient rapid strep and COVID-19 were negative this morning.  Advised patient to take medication as directed with food to completion.  Advised patient to take Prednisone with first dose of Augmentin for the next 5 of 7 days.  Encouraged patient to increase daily water intake while taking these medications.     ED Prescriptions     Medication Sig Dispense Auth. Provider   amoxicillin-clavulanate (AUGMENTIN) 875-125 MG tablet Take 1 tablet by mouth every 12 (twelve) hours. 14 tablet Trevor Iha, FNP   predniSONE (DELTASONE) 20 MG tablet Take 3 tabs PO daily x 5 days. 15 tablet Trevor Iha, FNP      PDMP not reviewed this encounter.   Trevor Iha, FNP 05/03/21 [phone]

## 2021-05-07 DIAGNOSIS — F3181 Bipolar II disorder: Secondary | ICD-10-CM | POA: Diagnosis not present

## 2021-05-09 ENCOUNTER — Emergency Department (INDEPENDENT_AMBULATORY_CARE_PROVIDER_SITE_OTHER)
Admission: RE | Admit: 2021-05-09 | Discharge: 2021-05-09 | Disposition: A | Discharge status: Home / Self Care | Payer: BC Managed Care – PPO | Source: Ambulatory Visit

## 2021-05-09 ENCOUNTER — Other Ambulatory Visit: Payer: Self-pay

## 2021-05-09 VITALS — BP 146/74 | HR 122 | Temp 98.8°F | Resp 16

## 2021-05-09 DIAGNOSIS — R059 Cough, unspecified: Secondary | ICD-10-CM

## 2021-05-09 DIAGNOSIS — J101 Influenza due to other identified influenza virus with other respiratory manifestations: Secondary | ICD-10-CM

## 2021-05-09 LAB — POC INFLUENZA A AND B ANTIGEN (URGENT CARE ONLY)
Influenza A Ag: POSITIVE — AB
Influenza B Ag: NEGATIVE

## 2021-05-09 MED ORDER — OSELTAMIVIR PHOSPHATE 75 MG PO CAPS: 75.0000 mg | ORAL_CAPSULE | Freq: Two times a day (BID) | ORAL | 0 refills | Status: DC

## 2021-05-09 MED ORDER — BENZONATATE 200 MG PO CAPS: 200.0000 mg | ORAL_CAPSULE | Freq: Three times a day (TID) | ORAL | 0 refills | Status: AC | PRN

## 2021-05-09 NOTE — ED Triage Notes (Addendum)
Pt c/o cough, headache, bodyaches and low grade fever since yesterday. No known flu or covid exposure. Tylenol cold and flu prn, last dose 2 hours ago. At home covid test neg 2 hours ago.

## 2021-05-09 NOTE — Discharge Instructions (Addendum)
Advised patient to take medication as directed with food to completion.  Advised patient may take Tessalon Perles daily or as needed for cough.  Encouraged patient increase daily water intake while taking these medications. °

## 2021-05-09 NOTE — ED Provider Notes (Signed)
[name]    CSN: [id number] Arrival date & time: 05/09/21  1904      History   Chief Complaint Chief Complaint  Patient presents with   Cough   Headache   Fever    HPI [name] is a 56 y.o. male.   HPI 56 year old male presents with cough, headaches, body aches and low-grade fever since yesterday.  No known COVID-19 or Influenza exposure.  Past Medical History:  Diagnosis Date   BPH (benign prostatic hyperplasia)    Chronic groin pain, right    Chronic prostatitis    non-bactrerial , followed by urologist,  takes elmiron   Dermatitis, unspecified    chronic intermittant   GAD (generalized anxiety disorder)    GERD (gastroesophageal reflux disease)    Hemorrhoids    History of anal fissures    History of chronic sinusitis    History of kidney stones    History of repair of hiatal hernia 2007   Lower urinary tract symptoms (LUTS)    Umbilical hernia    Wears contact lenses     Patient Active Problem List   Diagnosis Date Noted   Thrombosed external hemorrhoid 12/27/2012    Past Surgical History:  Procedure Laterality Date   INGUINAL HERNIA REPAIR Right 1992;  1998   LAPAROSCOPIC CHOLECYSTECTOMY  06-30-2001   @[facility]    LAPAROSCOPIC INGUINAL HERNIA REPAIR Left 03-31-2018  @[facility]    LAPAROSCOPIC NISSEN FUNDOPLICATION  02-27-2006   @[facility]    LAPAROSCOPY N/A 04/30/2020   Procedure: LAPAROSCOPY DIAGNOSTIC;  Surgeon: Quentin OreStechschulte, Paul J, MD;  Location: [facility];  Service: General;  Laterality: N/A;   LIPOMA EXCISION  2012   UMBILICAL HERNIA REPAIR N/A 04/30/2020   Procedure: HERNIA REPAIR UMBILICAL ADULT;  Surgeon: Dossie DerStechschulte, Hyman HopesPaul J, MD;  [facility];  Service: General;  Laterality: N/A;       Home Medications    Prior to Admission medications   Medication Sig Start Date End Date Taking? Authorizing Provider  benzonatate (TESSALON) 200 MG capsule Take 1 capsule (200 mg total) by mouth 3 (three) times  daily as needed for up to 7 days for cough. 05/09/21 05/16/21 Yes Trevor Ihaagan, Amayah Staheli, FNP  oseltamivir (TAMIFLU) 75 MG capsule Take 1 capsule (75 mg total) by mouth every 12 (twelve) hours. 05/09/21  Yes Trevor Ihaagan, Yarelin Reichardt, FNP  fexofenadine (ALLEGRA) 180 MG tablet Take 180 mg by mouth daily.    [provider]  lamoTRIgine (LAMICTAL) 150 MG tablet Take 150 mg by mouth daily.    [provider]  omeprazole-sodium bicarbonate (ZEGERID) 40-1100 MG capsule Take 1 capsule by mouth 2 (two) times daily.    [provider]  oxcarbazepine (TRILEPTAL) 600 MG tablet Take 600-1,800 mg by mouth 2 (two) times daily. Takes on tablet am and two tablet pm    [provider]  pentosan polysulfate (ELMIRON) 100 MG capsule Take 100 mg by mouth 3 (three) times daily before meals.    [provider]  predniSONE (DELTASONE) 20 MG tablet Take 3 tabs PO daily x 5 days. 05/03/21   Trevor Ihaagan, Aslin Farinas, FNP    Family History Family History  Problem Relation Age of Onset   Cancer Father        thyroid and prostate    Social History Social History   Tobacco Use   Smoking status: Former    Years: 20.00    Types: Cigarettes    Quit date: 05/06/2011    Years since quitting: 10.0  Smokeless tobacco: Never  Vaping Use   Vaping Use: Never used  Substance Use Topics   Alcohol use: Not Currently    Comment: rare   Drug use: Never     Allergies   Patient has no known allergies.   Review of Systems Review of Systems  Constitutional:  Positive for fever.  Respiratory:  Positive for cough.   Neurological:  Positive for headaches.  All other systems reviewed and are negative.   Physical Exam Triage Vital Signs ED Triage Vitals  Enc Vitals Group     BP 05/09/21 1917 (!) 146/74     Pulse Rate 05/09/21 1917 (!) 122     Resp 05/09/21 1917 16     Temp 05/09/21 1917 98.8 F (37.1 C)     Temp Source 05/09/21 1917 Oral     SpO2 05/09/21 1917 99 %     Weight --      Height --       Head Circumference --      Peak Flow --      Pain Score 05/09/21 1918 0     Pain Loc --      Pain Edu? --      Excl. in GC? --    No data found.  Updated Vital Signs BP (!) 146/74 (BP Location: Right Arm)    Pulse (!) 122    Temp 98.8 F (37.1 C) (Oral)    Resp 16    SpO2 99%     Physical Exam Vitals and nursing note reviewed.  Constitutional:      Appearance: Normal appearance. He is obese. He is ill-appearing.  HENT:     Head: Normocephalic and atraumatic.     Right Ear: Tympanic membrane, ear canal and external ear normal.     Left Ear: Tympanic membrane, ear canal and external ear normal.     Mouth/Throat:     Mouth: Mucous membranes are moist.     Pharynx: Oropharynx is clear.  Eyes:     Extraocular Movements: Extraocular movements intact.     Conjunctiva/sclera: Conjunctivae normal.     Pupils: Pupils are equal, round, and reactive to light.  Cardiovascular:     Rate and Rhythm: Normal rate and regular rhythm.     Pulses: Normal pulses.     Heart sounds: Normal heart sounds.  Pulmonary:     Effort: Pulmonary effort is normal.     Breath sounds: Normal breath sounds.  Musculoskeletal:     Cervical back: Normal range of motion and neck supple.  Skin:    General: Skin is warm and dry.  Neurological:     General: No focal deficit present.     Mental Status: He is alert and oriented to person, place, and time.     UC Treatments / Results  Labs (all labs ordered are listed, but only abnormal results are displayed) Labs Reviewed  POC INFLUENZA A AND B ANTIGEN (URGENT CARE ONLY) - Abnormal; Notable for the following components:      Result Value   Influenza A Ag Positive (*)    All other components within normal limits    EKG   Radiology No results found.  Procedures Procedures (including critical care time)  Medications Ordered in UC Medications - No data to display  Initial Impression / Assessment and Plan / UC Course  I have reviewed the triage  vital signs and the nursing notes.  Pertinent labs & imaging results that were available during  my care of the patient were reviewed by me and considered in my medical decision making (see chart for details).     MDM: 1.  Influenza A-Rx'd Tamiflu; 2.  Cough-Rx'd Tessalon Perles. Advised patient to take medication as directed with food to completion.  Advised patient may take Tessalon Perles daily or as needed for cough.  Encouraged patient increase daily water intake while taking these medications.  Work note provided per patient request prior to discharge.  Patient discharged home, hemodynamically stable. Final Clinical Impressions(s) / UC Diagnoses   Final diagnoses:  Influenza A  Cough, unspecified type     Discharge Instructions      Advised patient to take medication as directed with food to completion.  Advised patient may take Tessalon Perles daily or as needed for cough.  Encouraged patient increase daily water intake while taking these medications.     ED Prescriptions     Medication Sig Dispense Auth. Provider   oseltamivir (TAMIFLU) 75 MG capsule Take 1 capsule (75 mg total) by mouth every 12 (twelve) hours. 10 capsule Trevor Iha, FNP   benzonatate (TESSALON) 200 MG capsule Take 1 capsule (200 mg total) by mouth 3 (three) times daily as needed for up to 7 days for cough. 40 capsule Trevor Iha, FNP      PDMP not reviewed this encounter.   Trevor Iha, FNP 05/09/21 2037

## 2021-05-15 ENCOUNTER — Other Ambulatory Visit: Payer: Self-pay

## 2021-05-15 ENCOUNTER — Emergency Department
Admission: RE | Admit: 2021-05-15 | Discharge: 2021-05-15 | Disposition: A | Discharge status: Home / Self Care | Payer: BC Managed Care – PPO | Source: Ambulatory Visit | Attending: Family Medicine | Admitting: Family Medicine

## 2021-05-15 ENCOUNTER — Emergency Department (INDEPENDENT_AMBULATORY_CARE_PROVIDER_SITE_OTHER): Payer: BC Managed Care – PPO

## 2021-05-15 VITALS — BP 131/91 | HR 93 | Temp 98.3°F | Resp 18 | Ht 73.0 in | Wt 200.0 lb

## 2021-05-15 DIAGNOSIS — R509 Fever, unspecified: Secondary | ICD-10-CM | POA: Diagnosis not present

## 2021-05-15 DIAGNOSIS — J111 Influenza due to unidentified influenza virus with other respiratory manifestations: Secondary | ICD-10-CM | POA: Diagnosis not present

## 2021-05-15 DIAGNOSIS — R059 Cough, unspecified: Secondary | ICD-10-CM | POA: Diagnosis not present

## 2021-05-15 MED ORDER — PREDNISONE 20 MG PO TABS: ORAL_TABLET | ORAL | 0 refills | Status: DC

## 2021-05-15 NOTE — ED Provider Notes (Signed)
[name]    CSN: [id number] Arrival date & time: 05/15/21  1045      History   Chief Complaint Chief Complaint  Patient presents with   Cough    Cough, chest congestion and sob x3 days    HPI [name] is a 56 y.o. male.   HPI  Patient was seen on 05/09/2021 and diagnosed with influenza.  He states that he continues to have significant cough, chest congestion, sinus and head congestion, shortness of breath.  He is a non-smoker.  No underlying lung disease or asthma.  His chest hurts from the coughing. When he was diagnosed with flu he was treated with Tamiflu, prednisone, and Tessalon. He is not having any persistent fever He states his chest feels very congested but he is not able to cough up any of the mucus.  His chest rattles with coughing  Past Medical History:  Diagnosis Date   BPH (benign prostatic hyperplasia)    Chronic groin pain, right    Chronic prostatitis    non-bactrerial , followed by urologist,  takes elmiron   Dermatitis, unspecified    chronic intermittant   GAD (generalized anxiety disorder)    GERD (gastroesophageal reflux disease)    Hemorrhoids    History of anal fissures    History of chronic sinusitis    History of kidney stones    History of repair of hiatal hernia 2007   Lower urinary tract symptoms (LUTS)    Umbilical hernia    Wears contact lenses     Patient Active Problem List   Diagnosis Date Noted   Thrombosed external hemorrhoid 12/27/2012    Past Surgical History:  Procedure Laterality Date   INGUINAL HERNIA REPAIR Right 1992;  1998   LAPAROSCOPIC CHOLECYSTECTOMY  06-30-2001   @[facility]    LAPAROSCOPIC INGUINAL HERNIA REPAIR Left 03-31-2018  @[facility]    LAPAROSCOPIC NISSEN FUNDOPLICATION  02-27-2006   @[facility]    LAPAROSCOPY N/A 04/30/2020   Procedure: LAPAROSCOPY DIAGNOSTIC;  Surgeon: Quentin OreStechschulte, Paul J, MD;  Location: [facility];  Service: General;  Laterality: N/A;   LIPOMA EXCISION  2012    UMBILICAL HERNIA REPAIR N/A 04/30/2020   Procedure: HERNIA REPAIR UMBILICAL ADULT;  Surgeon: Dossie DerStechschulte, Hyman HopesPaul J, MD;  [facility];  Service: General;  Laterality: N/A;       Home Medications    Prior to Admission medications   Medication Sig Start Date End Date Taking? Authorizing Provider  benzonatate (TESSALON) 200 MG capsule Take 1 capsule (200 mg total) by mouth 3 (three) times daily as needed for up to 7 days for cough. 05/09/21 05/16/21 Yes Trevor Ihaagan, Michael, FNP  fexofenadine (ALLEGRA) 180 MG tablet Take 180 mg by mouth daily.   Yes [provider]  lamoTRIgine (LAMICTAL) 150 MG tablet Take 150 mg by mouth daily.   Yes [provider]  omeprazole-sodium bicarbonate (ZEGERID) 40-1100 MG capsule Take 1 capsule by mouth 2 (two) times daily.   Yes [provider]  oxcarbazepine (TRILEPTAL) 600 MG tablet Take 600-1,800 mg by mouth 2 (two) times daily. Takes on tablet am and two tablet pm   Yes [provider]  pentosan polysulfate (ELMIRON) 100 MG capsule Take 100 mg by mouth 3 (three) times daily before meals.   Yes [provider]  predniSONE (DELTASONE) 20 MG tablet Take 2 tabs PO daily x 5 days. 05/15/21   [name], [name] Witherington Sue, MD    Family History Family History  Problem Relation Age  of Onset   Cancer Father        thyroid and prostate    Social History Social History   Tobacco Use   Smoking status: Former    Years: 20.00    Types: Cigarettes    Quit date: 05/06/2011    Years since quitting: 10.0   Smokeless tobacco: Never  Vaping Use   Vaping Use: Never used  Substance Use Topics   Alcohol use: Not Currently    Comment: rare   Drug use: Never     Allergies   Patient has no known allergies.   Review of Systems Review of Systems See HPI  Physical Exam Triage Vital Signs ED Triage Vitals  Enc Vitals Group     BP 05/15/21 1115 (!) 131/91     Pulse Rate 05/15/21 1115 93     Resp 05/15/21 1115  18     Temp 05/15/21 1115 98.3 F (36.8 C)     Temp Source 05/15/21 1115 Oral     SpO2 05/15/21 1115 97 %     Weight 05/15/21 1113 200 lb (90.7 kg)     Height 05/15/21 1113 6\' 1"  (1.854 m)     Head Circumference --      Peak Flow --      Pain Score 05/15/21 1113 0     Pain Loc --      Pain Edu? --      Excl. in GC? --    No data found.  Updated Vital Signs BP (!) 131/91 (BP Location: Left Arm)    Pulse 93    Temp 98.3 F (36.8 C) (Oral)    Resp 18    Ht 6\' 1"  (1.854 m)    Wt 90.7 kg    SpO2 97%    BMI 26.39 kg/m     Physical Exam Constitutional:      General: He is not in acute distress.    Appearance: He is well-developed and normal weight. He is ill-appearing.  HENT:     Head: Normocephalic and atraumatic.     Right Ear: Tympanic membrane normal.     Left Ear: Tympanic membrane and ear canal normal.     Nose: Congestion and rhinorrhea present.     Mouth/Throat:     Pharynx: Posterior oropharyngeal erythema present.  Eyes:     Conjunctiva/sclera: Conjunctivae normal.     Pupils: Pupils are equal, round, and reactive to light.  Cardiovascular:     Rate and Rhythm: Normal rate and regular rhythm.     Heart sounds: Normal heart sounds.  Pulmonary:     Effort: Pulmonary effort is normal. No respiratory distress.     Breath sounds: Wheezing and rhonchi present.     Comments: Harsh cough.  Few scattered wheeze, rhonchi Abdominal:     General: There is no distension.     Palpations: Abdomen is soft.  Musculoskeletal:        General: Normal range of motion.     Cervical back: Normal range of motion.  Lymphadenopathy:     Cervical: Cervical adenopathy present.  Skin:    General: Skin is warm and dry.  Neurological:     General: No focal deficit present.     Mental Status: He is alert and oriented to person, place, and time.  Psychiatric:        Mood and Affect: Mood normal.     UC Treatments / Results  Labs (all labs ordered are listed, but only  abnormal results  are displayed) Labs Reviewed - No data to display  EKG   Radiology DG Chest 2 View  Result Date: 05/15/2021 CLINICAL DATA:  Cough and fever, recently diagnosed with flu 5 days ago EXAM: CHEST - 2 VIEW COMPARISON:  08/09/2008 FINDINGS: The heart size and mediastinal contours are within normal limits. Both lungs are clear. The visualized skeletal structures are unremarkable. IMPRESSION: No active cardiopulmonary disease. Electronically Signed   By: Judie Petit.  Shick M.D.   On: 05/15/2021 12:22    Procedures Procedures (including critical care time)  Medications Ordered in UC Medications - No data to display  Initial Impression / Assessment and Plan / UC Course  I have reviewed the triage vital signs and the nursing notes.  Pertinent labs & imaging results that were available during my care of the patient were reviewed by me and considered in my medical decision making (see chart for details).     Persistent viral illness.  No indication for antibiotics.  Patient states he felt better on the prednisone so we will repeat this at a lower dose.  No contraindications to prednisone.  PCP if not improving by next week Final Clinical Impressions(s) / UC Diagnoses   Final diagnoses:  Influenza with respiratory manifestation     Discharge Instructions      Chest x-ray is normal Continue to drink extra fluids Run a humidifier if you have one Try Mucinex DM to loosen mucus and help control cough Continue the tessalon 2 x a day Take ibuprofen 600 mg 3 times a day for chest pain Take prednisone as directed      ED Prescriptions     Medication Sig Dispense Auth. Provider   predniSONE (DELTASONE) 20 MG tablet Take 2 tabs PO daily x 5 days. 10 tablet [name] Moore, MD      PDMP not reviewed this encounter.   [name] Moore, MD 05/15/21 1757

## 2021-05-15 NOTE — ED Triage Notes (Signed)
Pt states that he has a cough, chest congestion and sob. X3 days  Pt states that he is vaccinated for covid. Pt states that he has had flu vaccine.  Pt states that he was diagnosed with the Flu 1 week ago.

## 2021-05-15 NOTE — Discharge Instructions (Addendum)
Chest x-ray is normal Continue to drink extra fluids Run a humidifier if you have one Try Mucinex DM to loosen mucus and help control cough Continue the tessalon 2 x a day Take ibuprofen 600 mg 3 times a day for chest pain Take prednisone as directed

## 2021-05-31 DIAGNOSIS — M9903 Segmental and somatic dysfunction of lumbar region: Secondary | ICD-10-CM | POA: Diagnosis not present

## 2021-05-31 DIAGNOSIS — M9904 Segmental and somatic dysfunction of sacral region: Secondary | ICD-10-CM | POA: Diagnosis not present

## 2021-05-31 DIAGNOSIS — M9902 Segmental and somatic dysfunction of thoracic region: Secondary | ICD-10-CM | POA: Diagnosis not present

## 2021-05-31 DIAGNOSIS — M5386 Other specified dorsopathies, lumbar region: Secondary | ICD-10-CM | POA: Diagnosis not present

## 2021-06-07 ENCOUNTER — Emergency Department
Admission: RE | Admit: 2021-06-07 | Discharge: 2021-06-07 | Disposition: A | Discharge status: Home / Self Care | Payer: BC Managed Care – PPO | Source: Ambulatory Visit | Attending: Family Medicine | Admitting: Family Medicine

## 2021-06-07 ENCOUNTER — Other Ambulatory Visit: Payer: Self-pay

## 2021-06-07 VITALS — BP 136/90 | HR 83 | Temp 98.1°F | Ht 73.0 in | Wt 205.0 lb

## 2021-06-07 DIAGNOSIS — J069 Acute upper respiratory infection, unspecified: Secondary | ICD-10-CM

## 2021-06-07 LAB — POCT INFLUENZA A/B
Influenza A, POC: NEGATIVE
Influenza B, POC: NEGATIVE

## 2021-06-07 LAB — POC SARS CORONAVIRUS 2 AG -  ED: SARS Coronavirus 2 Ag: NEGATIVE

## 2021-06-07 LAB — POCT RAPID STREP A (OFFICE): Rapid Strep A Screen: NEGATIVE

## 2021-06-07 MED ORDER — AMOXICILLIN 875 MG PO TABS: ORAL_TABLET | ORAL | 0 refills | Status: DC

## 2021-06-07 NOTE — ED Provider Notes (Signed)
[name]    CSN: [id number] Arrival date & time: 06/07/21  1846      History   Chief Complaint Chief Complaint  Patient presents with   Sore Throat    Sore throat, body aches, fever and ear pain. (Both ears) x1 day    HPI [name] is a 56 y.o. male.   Patient suddenly developed sore throat and hoarseness last night, followed by myalgias, increased sinus congestion with post-nasal drainage, bilateral ear fullness and fatigue.  He now has a minimal cough. He was treated for influenza A on 05/09/21, but on 05/15/21 he continued to have significant cough, chest congestion, sinus and head congestion, and shortness of breath.  A chest x-ray at that time was negative and he was treated with a course of prednisone.  He gradually improved but remained fatigued.  The history is provided by the patient.   Past Medical History:  Diagnosis Date   BPH (benign prostatic hyperplasia)    Chronic groin pain, right    Chronic prostatitis    non-bactrerial , followed by urologist,  takes elmiron   Dermatitis, unspecified    chronic intermittant   GAD (generalized anxiety disorder)    GERD (gastroesophageal reflux disease)    Hemorrhoids    History of anal fissures    History of chronic sinusitis    History of kidney stones    History of repair of hiatal hernia 2007   Lower urinary tract symptoms (LUTS)    Umbilical hernia    Wears contact lenses     Patient Active Problem List   Diagnosis Date Noted   Thrombosed external hemorrhoid 12/27/2012    Past Surgical History:  Procedure Laterality Date   INGUINAL HERNIA REPAIR Right 1992;  1998   LAPAROSCOPIC CHOLECYSTECTOMY  06-30-2001   @[facility]    LAPAROSCOPIC INGUINAL HERNIA REPAIR Left 03-31-2018  @[facility]    LAPAROSCOPIC NISSEN FUNDOPLICATION  02-27-2006   @[facility]    LAPAROSCOPY N/A 04/30/2020   Procedure: LAPAROSCOPY DIAGNOSTIC;  Surgeon: 03-01-2006, MD;  Location: [facility];  Service: General;   Laterality: N/A;   LIPOMA EXCISION  2012   UMBILICAL HERNIA REPAIR N/A 04/30/2020   Procedure: HERNIA REPAIR UMBILICAL ADULT;  Surgeon: 05/02/2020, Quentin Ore, MD;  Location: [facility];  Service: General;  Laterality: N/A;       Home Medications    Prior to Admission medications   Medication Sig Start Date End Date Taking? Authorizing Provider  amoxicillin (AMOXIL) 875 MG tablet Take one tab PO Q12hr 06/07/21  Yes Jerzi Tigert, Hyman Hopes, MD  fexofenadine (ALLEGRA) 180 MG tablet Take 180 mg by mouth daily.   Yes [provider]  lamoTRIgine (LAMICTAL) 150 MG tablet Take 150 mg by mouth daily.   Yes [provider]  omeprazole-sodium bicarbonate (ZEGERID) 40-1100 MG capsule Take 1 capsule by mouth 2 (two) times daily.   Yes [provider]  oxcarbazepine (TRILEPTAL) 600 MG tablet Take 600-1,800 mg by mouth 2 (two) times daily. Takes on tablet am and two tablet pm   Yes [provider]  pentosan polysulfate (ELMIRON) 100 MG capsule Take 100 mg by mouth 3 (three) times daily before meals.   Yes [provider]  predniSONE (DELTASONE) 20 MG tablet Take 2 tabs PO daily x 5 days. 05/15/21   08/05/21, MD    Family History Family History  Problem Relation Age of Onset   Cancer Father  thyroid and prostate    Social History Social History   Tobacco Use   Smoking status: Former    Years: 20.00    Types: Cigarettes    Quit date: 05/06/2011    Years since quitting: 10.0   Smokeless tobacco: Never  Vaping Use   Vaping Use: Never used  Substance Use Topics   Alcohol use: Not Currently    Comment: rare   Drug use: Never     Allergies   Patient has no known allergies.   Review of Systems Review of Systems + sore throat + hoarse + cough No pleuritic pain No wheezing + nasal congestion + post-nasal drainage No sinus pain/pressure No itchy/red eyes ? earache No hemoptysis No SOB No fever, + chills No  nausea No vomiting No abdominal pain No diarrhea No urinary symptoms No skin rash + fatigue + myalgias + mild headache   Physical Exam Triage Vital Signs ED Triage Vitals  Enc Vitals Group     BP 06/07/21 1901 136/90     Pulse Rate 06/07/21 1901 83     Resp --      Temp 06/07/21 1901 98.1 F (36.7 C)     Temp Source 06/07/21 1901 Oral     SpO2 06/07/21 1901 97 %     Weight 06/07/21 1859 205 lb (93 kg)     Height 06/07/21 1859 6\' 1"  (1.854 m)     Head Circumference --      Peak Flow --      Pain Score 06/07/21 1859 5     Pain Loc --      Pain Edu? --      Excl. in GC? --    No data found.  Updated Vital Signs BP 136/90 (BP Location: Right Arm)    Pulse 83    Temp 98.1 F (36.7 C) (Oral)    Ht 6\' 1"  (1.854 m)    Wt 93 kg    SpO2 97%    BMI 27.05 kg/m   Visual Acuity Right Eye Distance:   Left Eye Distance:   Bilateral Distance:    Right Eye Near:   Left Eye Near:    Bilateral Near:     Physical Exam Nursing notes and Vital Signs reviewed. Appearance:  Patient appears stated age, and in no acute distress Eyes:  Pupils are equal, round, and reactive to light and accomodation.  Extraocular movement is intact.  Conjunctivae are not inflamed  Ears:  Canals normal.  Tympanic membranes normal.  Nose:  Mildly congested turbinates.  No sinus tenderness.  Pharynx:  Normal; moist mucous membranes  Neck:  Supple.  Mildly enlarged lateral nodes are present, tender to palpation on the left.   Lungs:  Clear to auscultation.  Breath sounds are equal.  Moving air well. Heart:  Regular rate and rhythm without murmurs, rubs, or gallops.  Abdomen:  Nontender without masses or hepatosplenomegaly.  Bowel sounds are present.  No CVA or flank tenderness.  Extremities:  No edema.  Skin:  No rash present.   UC Treatments / Results  Labs (all labs ordered are listed, but only abnormal results are displayed) Labs Reviewed  POCT INFLUENZA A/B - Normal  POC SARS CORONAVIRUS 2 AG -   ED - Normal  POCT RAPID STREP A (OFFICE) - Normal    EKG   Radiology No results found.  Procedures Procedures (including critical care time)  Medications Ordered in UC Medications - No data to display  Initial  Impression / Assessment and Plan / UC Course  I have reviewed the triage vital signs and the nursing notes.  Pertinent labs & imaging results that were available during my care of the patient were reviewed by me and considered in my medical decision making (see chart for details).    After a prolonged influenza infection, now with an acute viral URI, patient probably somewhat immune suppressed.  Will begin amoxicillin. Followup with Family Doctor if not improved in about 10 days.  Final Clinical Impressions(s) / UC Diagnoses   Final diagnoses:  Viral URI     Discharge Instructions      Take plain guaifenesin (1200mg  extended release tabs such as Mucinex) twice daily, with plenty of water, for cough and congestion.  May add Pseudoephedrine (30mg , one or two every 4 to 6 hours) for sinus congestion.  Get adequate rest.   May use Afrin nasal spray (or generic oxymetazoline) each morning for about 5 days and then discontinue.  Also recommend using saline nasal spray several times daily and saline nasal irrigation (AYR is a common brand).  Use Flonase nasal spray each morning after using Afrin nasal spray and saline nasal irrigation. Try warm salt water gargles for sore throat.  Stop all antihistamines (Allegra, etc) for now, and other non-prescription cough/cold preparations. May take Delsym Cough Suppressant ("12 Hour Cough Relief") at bedtime for nighttime cough.     ED Prescriptions     Medication Sig Dispense Auth. Provider   amoxicillin (AMOXIL) 875 MG tablet Take one tab PO Q12hr 20 tablet Lattie HawBeese, Kanika Bungert A, MD         Lattie HawBeese, Elveria Lauderbaugh A, MD 06/09/21 [id number]

## 2021-06-07 NOTE — ED Triage Notes (Signed)
Pt states that he has a sore throat, body aches, fever and ear pain.(Both ears)x1 day   Pt states that he is vaccinated for covid. Pt states that he has had flu vaccine.

## 2021-06-07 NOTE — Discharge Instructions (Signed)
Take plain guaifenesin (1200mg extended release tabs such as Mucinex) twice daily, with plenty of water, for cough and congestion.  May add Pseudoephedrine (30mg, one or two every 4 to 6 hours) for sinus congestion.  Get adequate rest.   May use Afrin nasal spray (or generic oxymetazoline) each morning for about 5 days and then discontinue.  Also recommend using saline nasal spray several times daily and saline nasal irrigation (AYR is a common brand).  Use Flonase nasal spray each morning after using Afrin nasal spray and saline nasal irrigation. Try warm salt water gargles for sore throat.  Stop all antihistamines (Allegra, etc) for now, and other non-prescription cough/cold preparations. May take Delsym Cough Suppressant ("12 Hour Cough Relief") at bedtime for nighttime cough.    

## 2021-07-25 DIAGNOSIS — L408 Other psoriasis: Secondary | ICD-10-CM | POA: Diagnosis not present

## 2021-07-25 DIAGNOSIS — K219 Gastro-esophageal reflux disease without esophagitis: Secondary | ICD-10-CM | POA: Diagnosis not present

## 2021-07-28 ENCOUNTER — Other Ambulatory Visit: Payer: Self-pay

## 2021-07-28 ENCOUNTER — Emergency Department
Admission: EM | Admit: 2021-07-28 | Discharge: 2021-07-28 | Disposition: A | Discharge status: Home / Self Care | Payer: BC Managed Care – PPO | Source: Home / Self Care

## 2021-07-28 DIAGNOSIS — J029 Acute pharyngitis, unspecified: Secondary | ICD-10-CM | POA: Diagnosis not present

## 2021-07-28 MED ORDER — AZITHROMYCIN 250 MG PO TABS: 250.0000 mg | ORAL_TABLET | Freq: Every day | ORAL | 0 refills | Status: DC

## 2021-07-28 NOTE — Discharge Instructions (Addendum)
Advised patient to hold Zithromax for the next 5 to 6 days.  Advised patient if symptoms are worsening and/or unresolved may start Zithromax.  Advised patient to take medication as directed with food to completion.  Encourage patient increase daily water intake while taking this medication.  Advised patient if symptoms worsen and/or unresolved please follow-up with PCP or here for further evaluation. ?

## 2021-07-28 NOTE — ED Provider Notes (Signed)
?[facility] ? ? ? ?CSN: [id number] ?Arrival date & time: 07/28/21  1049 ? ? ?  ? ?History   ?Chief Complaint ?Chief Complaint  ?Patient presents with  ? Ear Pain  ?  Right ear painx2 days  ? ? ?HPI ?[name] is a 56 y.o. male.  ? ?HPI 56 year old male presents with right ear pain and swollen glands of neck for 2 days.  PMH significant for GAD and GERD.  Patient evaluated here for viral URI on 06/07/21 and prescribed Amoxicillin. ? ?Past Medical History:  ?Diagnosis Date  ? BPH (benign prostatic hyperplasia)   ? Chronic groin pain, right   ? Chronic prostatitis   ? non-bactrerial , followed by urologist,  takes elmiron  ? Dermatitis, unspecified   ? chronic intermittant  ? GAD (generalized anxiety disorder)   ? GERD (gastroesophageal reflux disease)   ? Hemorrhoids   ? History of anal fissures   ? History of chronic sinusitis   ? History of kidney stones   ? History of repair of hiatal hernia 2007  ? Lower urinary tract symptoms (LUTS)   ? Umbilical hernia   ? Wears contact lenses   ? ? ?Patient Active Problem List  ? Diagnosis Date Noted  ? Thrombosed external hemorrhoid 12/27/2012  ? ? ?Past Surgical History:  ?Procedure Laterality Date  ? INGUINAL HERNIA REPAIR Right 1992;  1998  ? LAPAROSCOPIC CHOLECYSTECTOMY  06-30-2001   @[facility]   ? LAPAROSCOPIC INGUINAL HERNIA REPAIR Left 03-31-2018  @[facility]   ? LAPAROSCOPIC NISSEN FUNDOPLICATION  02-27-2006   @[facility]   ? LAPAROSCOPY N/A 04/30/2020  ? Procedure: LAPAROSCOPY DIAGNOSTIC;  Surgeon: Quentin OreStechschulte, Paul J, MD;  [facility];  Service: General;  Laterality: N/A;  ? LIPOMA EXCISION  2012  ? UMBILICAL HERNIA REPAIR N/A 04/30/2020  ? Procedure: HERNIA REPAIR UMBILICAL ADULT;  Surgeon: Dossie DerStechschulte, Hyman HopesPaul J, MD;  [facility] [place];  Service: General;  Laterality: N/A;  ? ? ? ? ? ?Home Medications   ? ?Prior to Admission medications   ?Medication Sig Start Date End Date Taking? Authorizing Provider  ?azithromycin (ZITHROMAX)  250 MG tablet Take 1 tablet (250 mg total) by mouth daily. Take first 2 tablets together, then 1 every day until finished. 07/28/21  Yes Trevor Ihaagan, Longino Trefz, FNP  ?fexofenadine (ALLEGRA) 180 MG tablet Take 180 mg by mouth daily.   Yes [provider]  ?lamoTRIgine (LAMICTAL) 150 MG tablet Take 150 mg by mouth daily.   Yes [provider]  ?omeprazole-sodium bicarbonate (ZEGERID) 40-1100 MG capsule Take 1 capsule by mouth 2 (two) times daily.   Yes [provider]  ?oxcarbazepine (TRILEPTAL) 600 MG tablet Take 600-1,800 mg by mouth 2 (two) times daily. Takes on tablet am and two tablet pm   Yes [provider]  ?pentosan polysulfate (ELMIRON) 100 MG capsule Take 100 mg by mouth 3 (three) times daily before meals.   Yes [provider]  ? ? ?Family History ?Family History  ?Problem Relation Age of Onset  ? Cancer Father   ?     thyroid and prostate  ? ? ?Social History ?Social History  ? ?Tobacco Use  ? Smoking status: Former  ?  Years: 20.00  ?  Types: Cigarettes  ?  Quit date: 05/06/2011  ?  Years since quitting: 10.2  ? Smokeless tobacco: Never  ?Vaping Use  ? Vaping Use: Never used  ?Substance Use Topics  ? Alcohol use: Not Currently  ?  Comment: rare  ?  Drug use: Never  ? ? ? ?Allergies   ?Patient has no known allergies. ? ? ?Review of Systems ?Review of Systems  ?HENT:  Positive for ear pain and sore throat.   ?     Swollen glands of neck x2 days  ?All other systems reviewed and are negative. ? ? ?Physical Exam ?Triage Vital Signs ?ED Triage Vitals  ?Enc Vitals Group  ?   BP 07/28/21 1232 124/89  ?   Pulse Rate 07/28/21 1232 69  ?   Resp 07/28/21 1232 18  ?   Temp 07/28/21 1232 98.1 ?F (36.7 ?C)  ?   Temp src --   ?   SpO2 07/28/21 1232 98 %  ?   Weight 07/28/21 1230 205 lb (93 kg)  ?   Height 07/28/21 1230 6\' 1"  (1.854 m)  ?   Head Circumference --   ?   Peak Flow --   ?   Pain Score 07/28/21 1230 4  ?   Pain Loc --   ?   Pain Edu? --   ?   Excl. in GC? --   ? ?No data  found. ? ?Updated Vital Signs ?BP 124/89 (BP Location: Left Arm)   Pulse 69   Temp 98.1 ?F (36.7 ?C)   Resp 18   Ht 6\' 1"  (1.854 m)   Wt 205 lb (93 kg)   SpO2 98%   BMI 27.05 kg/m?  ? ? ?Physical Exam ?Vitals and nursing note reviewed.  ?Constitutional:   ?   General: He is not in acute distress. ?   Appearance: Normal appearance. He is normal weight. He is not ill-appearing.  ?HENT:  ?   Head: Normocephalic and atraumatic.  ?   Right Ear: Tympanic membrane, ear canal and external ear normal.  ?   Left Ear: Tympanic membrane, ear canal and external ear normal.  ?   Mouth/Throat:  ?   Mouth: Mucous membranes are moist.  ?   Pharynx: Oropharynx is clear.  ?Eyes:  ?   Extraocular Movements: Extraocular movements intact.  ?   Conjunctiva/sclera: Conjunctivae normal.  ?   Pupils: Pupils are equal, round, and reactive to light.  ?Cardiovascular:  ?   Rate and Rhythm: Normal rate and regular rhythm.  ?   Pulses: Normal pulses.  ?   Heart sounds: Normal heart sounds.  ?Pulmonary:  ?   Effort: Pulmonary effort is normal.  ?   Breath sounds: Normal breath sounds. No wheezing, rhonchi or rales.  ?Musculoskeletal:  ?   Cervical back: Normal range of motion and neck supple. Tenderness present.  ?Lymphadenopathy:  ?   Cervical: Cervical adenopathy present.  ?Skin: ?   General: Skin is warm and dry.  ?Neurological:  ?   General: No focal deficit present.  ?   Mental Status: He is alert and oriented to person, place, and time. Mental status is at baseline.  ? ? ? ?UC Treatments / Results  ?Labs ?(all labs ordered are listed, but only abnormal results are displayed) ?Labs Reviewed - No data to display ? ?EKG ? ? ?Radiology ?No results found. ? ?Procedures ?Procedures (including critical care time) ? ?Medications Ordered in UC ?Medications - No data to display ? ?Initial Impression / Assessment and Plan / UC Course  ?I have reviewed the triage vital signs and the nursing notes. ? ?Pertinent labs & imaging results that were  available during my care of the patient were reviewed by me and considered in  my medical decision making (see chart for details). ? ?  ? ?MDM: 1.  Acute pharyngitis, unspecified etiology-Rx'd Zithromax. Advised patient to hold Zithromax for the next 5 to 6 days.  Advised patient if symptoms are worsening and/or unresolved may start Zithromax.  Advised patient to take medication as directed with food to completion.  Encourage patient increase daily water intake while taking this medication.  Advised patient if symptoms worsen and/or unresolved please follow-up with PCP or here for further evaluation.  Patient discharged home, hemodynamically stable ?Final Clinical Impressions(s) / UC Diagnoses  ? ?Final diagnoses:  ?Acute pharyngitis, unspecified etiology  ? ? ? ?Discharge Instructions   ? ?  ?Advised patient to hold Zithromax for the next 5 to 6 days.  Advised patient if symptoms are worsening and/or unresolved may start Zithromax.  Advised patient to take medication as directed with food to completion.  Encourage patient increase daily water intake while taking this medication.  Advised patient if symptoms worsen and/or unresolved please follow-up with PCP or here for further evaluation. ? ? ? ? ?ED Prescriptions   ? ? Medication Sig Dispense Auth. Provider  ? azithromycin (ZITHROMAX) 250 MG tablet Take 1 tablet (250 mg total) by mouth daily. Take first 2 tablets together, then 1 every day until finished. 6 tablet Trevor Iha, FNP  ? ?  ? ?PDMP not reviewed this encounter. ?  ?Trevor Iha, FNP ?07/28/21 1400 ? ?

## 2021-07-28 NOTE — ED Triage Notes (Signed)
Pt states that he has some right ear pain and has some swollen glands in his neck. X2 days ? ? ?Pt states that he is vaccinated for covid ?Pt states that he has had flu vaccine ?

## 2021-08-13 DIAGNOSIS — F3181 Bipolar II disorder: Secondary | ICD-10-CM | POA: Diagnosis not present

## 2021-09-03 DIAGNOSIS — L308 Other specified dermatitis: Secondary | ICD-10-CM | POA: Diagnosis not present

## 2021-11-07 DIAGNOSIS — E559 Vitamin D deficiency, unspecified: Secondary | ICD-10-CM | POA: Diagnosis not present

## 2021-11-07 DIAGNOSIS — Z13220 Encounter for screening for lipoid disorders: Secondary | ICD-10-CM | POA: Diagnosis not present

## 2021-11-07 DIAGNOSIS — Z0000 Encounter for general adult medical examination without abnormal findings: Secondary | ICD-10-CM | POA: Diagnosis not present

## 2021-11-07 DIAGNOSIS — Z125 Encounter for screening for malignant neoplasm of prostate: Secondary | ICD-10-CM | POA: Diagnosis not present

## 2021-11-14 DIAGNOSIS — F3181 Bipolar II disorder: Secondary | ICD-10-CM | POA: Diagnosis not present

## 2021-11-28 DIAGNOSIS — R21 Rash and other nonspecific skin eruption: Secondary | ICD-10-CM | POA: Diagnosis not present

## 2021-11-28 DIAGNOSIS — K649 Unspecified hemorrhoids: Secondary | ICD-10-CM | POA: Diagnosis not present

## 2021-11-28 DIAGNOSIS — Z0000 Encounter for general adult medical examination without abnormal findings: Secondary | ICD-10-CM | POA: Diagnosis not present

## 2021-11-28 DIAGNOSIS — N411 Chronic prostatitis: Secondary | ICD-10-CM | POA: Diagnosis not present

## 2021-11-28 DIAGNOSIS — Z23 Encounter for immunization: Secondary | ICD-10-CM | POA: Diagnosis not present

## 2021-11-28 DIAGNOSIS — K219 Gastro-esophageal reflux disease without esophagitis: Secondary | ICD-10-CM | POA: Diagnosis not present

## 2022-01-30 ENCOUNTER — Ambulatory Visit
Admission: RE | Admit: 2022-01-30 | Discharge: 2022-01-30 | Disposition: A | Discharge status: Home / Self Care | Payer: BC Managed Care – PPO | Source: Ambulatory Visit | Attending: Emergency Medicine | Admitting: Emergency Medicine

## 2022-01-30 VITALS — BP 126/88 | HR 77 | Temp 98.5°F | Resp 17

## 2022-01-30 DIAGNOSIS — J029 Acute pharyngitis, unspecified: Secondary | ICD-10-CM

## 2022-01-30 DIAGNOSIS — J0100 Acute maxillary sinusitis, unspecified: Secondary | ICD-10-CM

## 2022-01-30 LAB — POCT RAPID STREP A (OFFICE): Rapid Strep A Screen: NEGATIVE

## 2022-01-30 MED ORDER — AMOXICILLIN-POT CLAVULANATE 875-125 MG PO TABS: 1.0000 | ORAL_TABLET | Freq: Two times a day (BID) | ORAL | 0 refills | Status: DC

## 2022-01-30 NOTE — ED Triage Notes (Signed)
Pt c/o sore throat and headache x 3 days. Says sore throat feels a little better today. Denies fever but 56 y/o tested pos for strep today. Taking ibuprofen prn.

## 2022-01-30 NOTE — ED Provider Notes (Signed)
[name]    CSN: [id number] Arrival date & time: 01/30/22  1523      History   Chief Complaint Chief Complaint  Patient presents with   Sore Throat    HPI [name] is a 56 y.o. male.  Patient presents with a chief complaint of sore throat and headache x 3 days.  Patient endorses strep exposure from his daughter who was diagnosed today.  Patient endorses painful swallowing with consistent pain . Patient endorses history of seasonal allergies.  Patient has taken ibuprofen with no relief of symptoms.  Patient takes Allegra daily for seasonal allergies.   Sore Throat Pertinent negatives include no shortness of breath.    Past Medical History:  Diagnosis Date   BPH (benign prostatic hyperplasia)    Chronic groin pain, right    Chronic prostatitis    non-bactrerial , followed by urologist,  takes elmiron   Dermatitis, unspecified    chronic intermittant   GAD (generalized anxiety disorder)    GERD (gastroesophageal reflux disease)    Hemorrhoids    History of anal fissures    History of chronic sinusitis    History of kidney stones    History of repair of hiatal hernia 2007   Lower urinary tract symptoms (LUTS)    Umbilical hernia    Wears contact lenses     Patient Active Problem List   Diagnosis Date Noted   Thrombosed external hemorrhoid 12/27/2012    Past Surgical History:  Procedure Laterality Date   INGUINAL HERNIA REPAIR Right 1992;  1998   LAPAROSCOPIC CHOLECYSTECTOMY  06-30-2001   @[facility]    LAPAROSCOPIC INGUINAL HERNIA REPAIR Left 03-31-2018  @[facility]    LAPAROSCOPIC NISSEN FUNDOPLICATION  02-27-2006   @[facility]    LAPAROSCOPY N/A 04/30/2020   Procedure: LAPAROSCOPY DIAGNOSTIC;  Surgeon: 03-01-2006, MD;  Location: [facility];  Service: General;  Laterality: N/A;   LIPOMA EXCISION  2012   UMBILICAL HERNIA REPAIR N/A 04/30/2020   Procedure: HERNIA REPAIR UMBILICAL ADULT;  Surgeon: 05/02/2020, Quentin Ore, MD;  Location: [facility]  [place];  Service: General;  Laterality: N/A;       Home Medications    Prior to Admission medications   Medication Sig Start Date End Date Taking? Authorizing Provider  amoxicillin-clavulanate (AUGMENTIN) 875-125 MG tablet Take 1 tablet by mouth every 12 (twelve) hours. 01/30/22  Yes Hyman Hopes, NP  fexofenadine (ALLEGRA) 180 MG tablet Take 180 mg by mouth daily.    [provider]  lamoTRIgine (LAMICTAL) 150 MG tablet Take 150 mg by mouth daily.    [provider]  omeprazole-sodium bicarbonate (ZEGERID) 40-1100 MG capsule Take 1 capsule by mouth 2 (two) times daily.    [provider]  oxcarbazepine (TRILEPTAL) 600 MG tablet Take 600-1,800 mg by mouth 2 (two) times daily. Takes on tablet am and two tablet pm    [provider]  pentosan polysulfate (ELMIRON) 100 MG capsule Take 100 mg by mouth 3 (three) times daily before meals.    [provider]    Family History Family History  Problem Relation Age of Onset   Cancer Father        thyroid and prostate    Social History Social History   Tobacco Use   Smoking status: Former    Years: 20.00    Types: Cigarettes    Quit date: 05/06/2011    Years since quitting: 10.7   Smokeless tobacco: Never  Vaping Use  Vaping Use: Never used  Substance Use Topics   Alcohol use: Not Currently    Comment: rare   Drug use: Never     Allergies   Patient has no known allergies.   Review of Systems Review of Systems  Constitutional:  Positive for fatigue.  HENT:  Positive for congestion, sinus pressure and sore throat. Negative for ear discharge, ear pain, facial swelling, mouth sores, rhinorrhea, sinus pain and trouble swallowing.   Eyes: Negative.   Respiratory:  Negative for cough, shortness of breath and wheezing.   Cardiovascular: Negative.      Physical Exam Triage Vital Signs ED Triage Vitals [01/30/22 1536]  Enc Vitals Group     BP 126/88     Pulse  Rate 77     Resp 17     Temp 98.5 F (36.9 C)     Temp Source Oral     SpO2 99 %     Weight      Height      Head Circumference      Peak Flow      Pain Score 0     Pain Loc      Pain Edu?      Excl. in GC?    Physical Exam Constitutional:      Appearance: He is well-developed.  HENT:     Head: Normocephalic.     Right Ear: Tympanic membrane and ear canal normal. No drainage, swelling or tenderness. No middle ear effusion. Tympanic membrane is not erythematous.     Left Ear: Tympanic membrane and ear canal normal. No drainage, swelling or tenderness.  No middle ear effusion. Tympanic membrane is not erythematous.     Nose: Congestion present. No rhinorrhea.     Right Turbinates: Not enlarged.     Left Turbinates: Not enlarged.     Right Sinus: No maxillary sinus tenderness.     Left Sinus: Maxillary sinus tenderness present.     Mouth/Throat:     Mouth: Mucous membranes are moist.     Tonsils: No tonsillar exudate or tonsillar abscesses.     Comments: Erythema present in oropharynx.  Eyes:     Conjunctiva/sclera: Conjunctivae normal.  Pulmonary:     Effort: Pulmonary effort is normal.     Breath sounds: Normal breath sounds.  Lymphadenopathy:     Cervical: Cervical adenopathy present.     Right cervical: Superficial cervical adenopathy present.     Left cervical: Superficial cervical adenopathy present.     Comments: Tenderness upon palpation of RT and LFT cervical nodes.   Neurological:     Mental Status: He is alert.     No data found.  Updated Vital Signs BP 126/88 (BP Location: Left Arm)   Pulse 77   Temp 98.5 F (36.9 C) (Oral)   Resp 17   SpO2 99%       UC Treatments / Results  Labs (all labs ordered are listed, but only abnormal results are displayed) Labs Reviewed  CULTURE, GROUP A STREP [facility])  POCT RAPID STREP A (OFFICE)    EKG   Radiology No results found.  Procedures Procedures (including critical care time)  Medications Ordered in  UC Medications - No data to display  Initial Impression / Assessment and Plan / UC Course  I have reviewed the triage vital signs and the nursing notes.  Pertinent labs & imaging results that were available during my care of the patient were reviewed by me and considered in  my medical decision making (see chart for details).   Patient was treated for acute maxillary sinusitis.  Patient was given prescription for Augmentin.  Education was given on possible side effects of antibiotic use.  Patient was advised to continue taking Allegra daily for seasonal allergies.  Patient was advised to follow-up with PCP or at this clinic if symptoms do not improve. Final Clinical Impressions(s) / UC Diagnoses   Final diagnoses:  Acute pharyngitis, unspecified etiology  Acute non-recurrent maxillary sinusitis     Discharge Instructions      I have given you a prescription for Augmentin, you will take this medication 2 times daily for the next 7 days.  I recommend that you eat food when taking Augmentin, it can cause stomach upset.  Your strep test was negative.  I am treating you for maxillary sinusitis.  You can continue taking your Allegra daily.  You may take Tylenol as needed every 4-6 hours for pain.  Please continue to remain hydrated, drink at least 8 cups of water daily.     ED Prescriptions     Medication Sig Dispense Auth. Provider   amoxicillin-clavulanate (AUGMENTIN) 875-125 MG tablet Take 1 tablet by mouth every 12 (twelve) hours. 14 tablet Flossie Dibble, NP      PDMP not reviewed this encounter.   Flossie Dibble, NP 01/30/22 [phone]

## 2022-01-30 NOTE — Discharge Instructions (Addendum)
I have given you a prescription for Augmentin, you will take this medication 2 times daily for the next 7 days.  I recommend that you eat food when taking Augmentin, it can cause stomach upset.  Your strep test was negative.  I am treating you for maxillary sinusitis.  You can continue taking your Allegra daily.  You may take Tylenol as needed every 4-6 hours for pain.  Please continue to remain hydrated, drink at least 8 cups of water daily.

## 2022-02-02 LAB — CULTURE, GROUP A STREP (THRC)

## 2022-03-01 DIAGNOSIS — G43009 Migraine without aura, not intractable, without status migrainosus: Secondary | ICD-10-CM | POA: Diagnosis not present

## 2022-03-01 DIAGNOSIS — R519 Headache, unspecified: Secondary | ICD-10-CM | POA: Diagnosis not present

## 2022-03-01 DIAGNOSIS — R1111 Vomiting without nausea: Secondary | ICD-10-CM | POA: Diagnosis not present

## 2022-03-01 DIAGNOSIS — Z79899 Other long term (current) drug therapy: Secondary | ICD-10-CM | POA: Diagnosis not present

## 2022-03-01 DIAGNOSIS — G43901 Migraine, unspecified, not intractable, with status migrainosus: Secondary | ICD-10-CM | POA: Diagnosis not present

## 2022-03-01 DIAGNOSIS — E860 Dehydration: Secondary | ICD-10-CM | POA: Diagnosis not present

## 2022-03-12 DIAGNOSIS — Z6827 Body mass index (BMI) 27.0-27.9, adult: Secondary | ICD-10-CM | POA: Diagnosis not present

## 2022-03-12 DIAGNOSIS — J029 Acute pharyngitis, unspecified: Secondary | ICD-10-CM | POA: Diagnosis not present

## 2022-03-12 DIAGNOSIS — G4483 Primary cough headache: Secondary | ICD-10-CM | POA: Diagnosis not present

## 2022-03-12 DIAGNOSIS — R509 Fever, unspecified: Secondary | ICD-10-CM | POA: Diagnosis not present

## 2022-04-12 DIAGNOSIS — U071 COVID-19: Secondary | ICD-10-CM | POA: Diagnosis not present

## 2022-04-12 DIAGNOSIS — J029 Acute pharyngitis, unspecified: Secondary | ICD-10-CM | POA: Diagnosis not present

## 2022-04-12 DIAGNOSIS — Z6827 Body mass index (BMI) 27.0-27.9, adult: Secondary | ICD-10-CM | POA: Diagnosis not present

## 2022-04-20 ENCOUNTER — Telehealth: Payer: Self-pay | Admitting: Family Medicine

## 2022-04-20 ENCOUNTER — Ambulatory Visit
Admission: EM | Admit: 2022-04-20 | Discharge: 2022-04-20 | Disposition: A | Discharge status: Home / Self Care | Payer: BC Managed Care – PPO

## 2022-04-20 ENCOUNTER — Other Ambulatory Visit: Payer: Self-pay

## 2022-04-20 ENCOUNTER — Encounter: Payer: Self-pay | Admitting: Emergency Medicine

## 2022-04-20 DIAGNOSIS — J0100 Acute maxillary sinusitis, unspecified: Secondary | ICD-10-CM

## 2022-04-20 MED ORDER — PREDNISONE 20 MG PO TABS: ORAL_TABLET | ORAL | 0 refills | Status: DC

## 2022-04-20 MED ORDER — AMOXICILLIN 875 MG PO TABS: 875.0000 mg | ORAL_TABLET | Freq: Two times a day (BID) | ORAL | 0 refills | Status: DC

## 2022-04-20 NOTE — Telephone Encounter (Signed)
Patient medication sent to pharmacy as advised.

## 2022-04-20 NOTE — Discharge Instructions (Addendum)
Patient to take medication as directed with food to completion.  Advised patient to take prednisone with first dose of amoxicillin for the next 5 of 7 days.  Encouraged patient to increase daily water intake to 64 ounces per day while taking this medication.  Advised if symptoms worsen and or unresolved please follow-up PCP or here for further evaluation.

## 2022-04-20 NOTE — ED Triage Notes (Signed)
Patient presents to Urgent Care with complaints of ear pain, sore throat since 1 week ago. Patient reports some dysphagia, some postnasal drainage, headache, right ear pain. Denies any sinus pressure, or fever or chills. Home covid tests negative. Taking Tylenol and Aleve for symptoms.

## 2022-04-20 NOTE — ED Provider Notes (Signed)
[name]    CSN: [id number] Arrival date & time: 04/20/22  0914      History   Chief Complaint Chief Complaint  Patient presents with   Sore Throat   Otalgia    HPI STEIN WINDHORST is a 56 y.o. male.   HPI 56 year old male presents with bilateral ear pain and sore throat for 1 week.  PMH significant for obesity, chronic sinusitis, and BPH  Past Medical History:  Diagnosis Date   BPH (benign prostatic hyperplasia)    Chronic groin pain, right    Chronic prostatitis    non-bactrerial , followed by urologist,  takes elmiron   Dermatitis, unspecified    chronic intermittant   GAD (generalized anxiety disorder)    GERD (gastroesophageal reflux disease)    Hemorrhoids    History of anal fissures    History of chronic sinusitis    History of kidney stones    History of repair of hiatal hernia 2007   Lower urinary tract symptoms (LUTS)    Umbilical hernia    Wears contact lenses     Patient Active Problem List   Diagnosis Date Noted   Thrombosed external hemorrhoid 12/27/2012    Past Surgical History:  Procedure Laterality Date   INGUINAL HERNIA REPAIR Right 1992;  1998   LAPAROSCOPIC CHOLECYSTECTOMY  06-30-2001   @[facility]    LAPAROSCOPIC INGUINAL HERNIA REPAIR Left 03-31-2018  @[facility]    LAPAROSCOPIC NISSEN FUNDOPLICATION  02-27-2006   @[facility]    LAPAROSCOPY N/A 04/30/2020   Procedure: LAPAROSCOPY DIAGNOSTIC;  Surgeon: 03-01-2006, MD;  Location: [facility];  Service: General;  Laterality: N/A;   LIPOMA EXCISION  2012   UMBILICAL HERNIA REPAIR N/A 04/30/2020   Procedure: HERNIA REPAIR UMBILICAL ADULT;  Surgeon: 05/02/2020, Quentin Ore, MD;  Location: [facility];  Service: General;  Laterality: N/A;       Home Medications    Prior to Admission medications   Medication Sig Start Date End Date Taking? Authorizing Provider  fexofenadine (ALLEGRA) 180 MG tablet Take 180 mg by mouth daily.   Yes [provider]   lamoTRIgine (LAMICTAL) 150 MG tablet Take 150 mg by mouth daily.   Yes [provider]  omeprazole-sodium bicarbonate (ZEGERID) 40-1100 MG capsule Take 1 capsule by mouth 2 (two) times daily.   Yes [provider]  oxcarbazepine (TRILEPTAL) 600 MG tablet Take 600-1,800 mg by mouth 2 (two) times daily. Takes on tablet am and two tablet pm   Yes [provider]  pentosan polysulfate (ELMIRON) 100 MG capsule Take 100 mg by mouth 3 (three) times daily before meals.   Yes [provider]    Family History Family History  Problem Relation Age of Onset   Cancer Father        thyroid and prostate    Social History Social History   Tobacco Use   Smoking status: Former    Years: 20.00    Types: Cigarettes    Quit date: 05/06/2011    Years since quitting: 10.9   Smokeless tobacco: Never  Vaping Use   Vaping Use: Never used  Substance Use Topics   Alcohol use: Not Currently    Comment: rare   Drug use: Never     Allergies   Patient has no known allergies.   Review of Systems Review of Systems  HENT:  Positive for congestion and sore throat.   All other systems reviewed and are negative.    Physical Exam  Triage Vital Signs ED Triage Vitals  Enc Vitals Group     BP 04/20/22 1031 128/89     Pulse Rate 04/20/22 1031 89     Resp 04/20/22 1031 18     Temp 04/20/22 1031 98.6 F (37 C)     Temp Source 04/20/22 1031 Oral     SpO2 04/20/22 1031 97 %     Weight --      Height --      Head Circumference --      Peak Flow --      Pain Score 04/20/22 1030 4     Pain Loc --      Pain Edu? --      Excl. in GC? --    No data found.  Updated Vital Signs BP 128/89 (BP Location: Right Arm)   Pulse 89   Temp 98.6 F (37 C) (Oral)   Resp 18   SpO2 97%       Physical Exam Vitals and nursing note reviewed.  Constitutional:      Appearance: Normal appearance. He is well-developed. He is obese. He is not ill-appearing.  HENT:     Head:  Normocephalic and atraumatic.     Right Ear: Tympanic membrane and external ear normal.     Left Ear: Tympanic membrane and external ear normal.     Ears:     Comments: Moderate eustachian tube dysfunction noted bilaterally    Mouth/Throat:     Mouth: Mucous membranes are moist.     Pharynx: Oropharynx is clear. Uvula midline. Posterior oropharyngeal erythema and uvula swelling present.  Eyes:     Extraocular Movements: Extraocular movements intact.     Conjunctiva/sclera: Conjunctivae normal.     Pupils: Pupils are equal, round, and reactive to light.  Cardiovascular:     Rate and Rhythm: Normal rate and regular rhythm.     Pulses: Normal pulses.     Heart sounds: Normal heart sounds. No murmur heard. Pulmonary:     Effort: Pulmonary effort is normal.     Breath sounds: Normal breath sounds. No wheezing, rhonchi or rales.  Musculoskeletal:        General: Normal range of motion.     Cervical back: Normal range of motion and neck supple.  Skin:    General: Skin is warm and dry.  Neurological:     General: No focal deficit present.     Mental Status: He is alert and oriented to person, place, and time.      UC Treatments / Results  Labs (all labs ordered are listed, but only abnormal results are displayed) Labs Reviewed - No data to display  EKG   Radiology No results found.  Procedures Procedures (including critical care time)  Medications Ordered in UC Medications - No data to display  Initial Impression / Assessment and Plan / UC Course  I have reviewed the triage vital signs and the nursing notes.  Pertinent labs & imaging results that were available during my care of the patient were reviewed by me and considered in my medical decision making (see chart for details).     MDM: 1.  Subacute maxillary sinusitis-Rx'd amoxicillin, prednisone. Patient to take medication as directed with food to completion.  Advised patient to take prednisone with first dose of  amoxicillin for the next 5 of 7 days.  Encouraged patient to increase daily water intake to 64 ounces per day while taking this medication.  Advised if symptoms worsen  and or unresolved please follow-up PCP or here for further evaluation.  Discharged home, hemodynamically stable. Final Clinical Impressions(s) / UC Diagnoses   Final diagnoses:  Subacute maxillary sinusitis     Discharge Instructions      Patient to take medication as directed with food to completion.  Advised patient to take prednisone with first dose of amoxicillin for the next 5 of 7 days.  Encouraged patient to increase daily water intake to 64 ounces per day while taking this medication.  Advised if symptoms worsen and or unresolved please follow-up PCP or here for further evaluation.     ED Prescriptions   None    PDMP not reviewed this encounter.   Trevor Iha, FNP 04/20/22 1125

## 2022-04-26 ENCOUNTER — Telehealth: Payer: Self-pay | Admitting: Emergency Medicine

## 2022-04-26 ENCOUNTER — Ambulatory Visit
Admission: RE | Admit: 2022-04-26 | Discharge: 2022-04-26 | Disposition: A | Discharge status: Home / Self Care | Payer: BC Managed Care – PPO | Source: Ambulatory Visit | Attending: Family Medicine | Admitting: Family Medicine

## 2022-04-26 VITALS — BP 130/90 | HR 73 | Temp 97.7°F | Resp 18

## 2022-04-26 DIAGNOSIS — J0101 Acute recurrent maxillary sinusitis: Secondary | ICD-10-CM | POA: Diagnosis not present

## 2022-04-26 DIAGNOSIS — J029 Acute pharyngitis, unspecified: Secondary | ICD-10-CM

## 2022-04-26 MED ORDER — AZITHROMYCIN 250 MG PO TABS: ORAL_TABLET | ORAL | 0 refills | Status: DC

## 2022-04-26 MED ORDER — FLUTICASONE PROPIONATE 50 MCG/ACT NA SUSP: 2.0000 | Freq: Every day | NASAL | 0 refills | Status: AC

## 2022-04-26 NOTE — ED Provider Notes (Signed)
[name]    CSN: [id number] Arrival date & time: 04/26/22  0851      History   Chief Complaint Chief Complaint  Patient presents with   Ear Fullness    APPT 9AM   Sore Throat    HPI [name] is a 56 y.o. male.   HPI  Patient is a Engineer, site.  States he has been sick for weeks.  He has already had a visit here and took 7 days of amoxicillin.  He took prednisone.  The prednisone made him briefly feel better but now he has ear pressure and pain, sore throat, nasal congestion, painful swallowing  Past Medical History:  Diagnosis Date   BPH (benign prostatic hyperplasia)    Chronic groin pain, right    Chronic prostatitis    non-bactrerial , followed by urologist,  takes elmiron   Dermatitis, unspecified    chronic intermittant   GAD (generalized anxiety disorder)    GERD (gastroesophageal reflux disease)    Hemorrhoids    History of anal fissures    History of chronic sinusitis    History of kidney stones    History of repair of hiatal hernia 2007   Lower urinary tract symptoms (LUTS)    Umbilical hernia    Wears contact lenses     Patient Active Problem List   Diagnosis Date Noted   Thrombosed external hemorrhoid 12/27/2012    Past Surgical History:  Procedure Laterality Date   INGUINAL HERNIA REPAIR Right 1992;  1998   LAPAROSCOPIC CHOLECYSTECTOMY  06-30-2001   @[facility]    LAPAROSCOPIC INGUINAL HERNIA REPAIR Left 03-31-2018  @[facility]    LAPAROSCOPIC NISSEN FUNDOPLICATION  02-27-2006   @[facility]    LAPAROSCOPY N/A 04/30/2020   Procedure: LAPAROSCOPY DIAGNOSTIC;  Surgeon: 03-01-2006, MD;  Location: [facility];  Service: General;  Laterality: N/A;   LIPOMA EXCISION  2012   UMBILICAL HERNIA REPAIR N/A 04/30/2020   Procedure: HERNIA REPAIR UMBILICAL ADULT;  Surgeon: 05/02/2020, Quentin Ore, MD;  Location: [facility];  Service: General;  Laterality: N/A;       Home Medications    Prior to Admission  medications   Medication Sig Start Date End Date Taking? Authorizing Provider  azithromycin (ZITHROMAX Z-PAK) 250 MG tablet Take two pills today followed by one a day until gone 04/26/22  Yes Hyman Hopes, MD  fluticasone [facility]) 50 MCG/ACT nasal spray Place 2 sprays into both nostrils daily. 04/26/22  Yes Eustace Moore, MD  fexofenadine (ALLEGRA) 180 MG tablet Take 180 mg by mouth daily.    [provider]  lamoTRIgine (LAMICTAL) 150 MG tablet Take 150 mg by mouth daily.    [provider]  omeprazole-sodium bicarbonate (ZEGERID) 40-1100 MG capsule Take 1 capsule by mouth 2 (two) times daily.    [provider]  oxcarbazepine (TRILEPTAL) 600 MG tablet Take 600-1,800 mg by mouth 2 (two) times daily. Takes on tablet am and two tablet pm    [provider]  pentosan polysulfate (ELMIRON) 100 MG capsule Take 100 mg by mouth 3 (three) times daily before meals.    [provider]    Family History Family History  Problem Relation Age of Onset   Cancer Father        thyroid and prostate    Social History Social History   Tobacco Use   Smoking status: Former    Years: 20.00    Types: Cigarettes    Quit date: 05/06/2011  Years since quitting: 10.9   Smokeless tobacco: Never  Vaping Use   Vaping Use: Never used  Substance Use Topics   Alcohol use: Not Currently    Comment: rare   Drug use: Never     Allergies   Patient has no known allergies.   Review of Systems Review of Systems  See HPI Physical Exam Triage Vital Signs ED Triage Vitals [04/26/22 0906]  Enc Vitals Group     BP (!) 130/90     Pulse Rate 73     Resp 18     Temp 97.7 F (36.5 C)     Temp Source Oral     SpO2 98 %     Weight      Height      Head Circumference      Peak Flow      Pain Score 3     Pain Loc      Pain Edu?      Excl. in GC?    No data found.  Updated Vital Signs BP (!) 130/90 (BP Location: Left Arm)   Pulse 73   Temp 97.7  F (36.5 C) (Oral)   Resp 18   SpO2 98%      Physical Exam Constitutional:      General: He is not in acute distress.    Appearance: He is well-developed. He is ill-appearing.  HENT:     Head: Normocephalic and atraumatic.     Right Ear: Tympanic membrane normal.     Left Ear: Tympanic membrane and ear canal normal.     Nose: Congestion and rhinorrhea present.     Mouth/Throat:     Pharynx: Pharyngeal swelling and posterior oropharyngeal erythema present.     Tonsils: No tonsillar exudate. 0 on the right. 0 on the left.  Eyes:     Conjunctiva/sclera: Conjunctivae normal.     Pupils: Pupils are equal, round, and reactive to light.  Cardiovascular:     Rate and Rhythm: Normal rate.  Pulmonary:     Effort: Pulmonary effort is normal. No respiratory distress.  Abdominal:     General: There is no distension.     Palpations: Abdomen is soft.  Musculoskeletal:        General: Normal range of motion.     Cervical back: Normal range of motion.  Lymphadenopathy:     Cervical: Cervical adenopathy present.  Skin:    General: Skin is warm and dry.  Neurological:     Mental Status: He is alert.      UC Treatments / Results  Labs (all labs ordered are listed, but only abnormal results are displayed) Labs Reviewed - No data to display  EKG   Radiology No results found.  Procedures Procedures (including critical care time)  Medications Ordered in UC Medications - No data to display  Initial Impression / Assessment and Plan / UC Course  I have reviewed the triage vital signs and the nursing notes.  Pertinent labs & imaging results that were available during my care of the patient were reviewed by me and considered in my medical decision making (see chart for details).     Final Clinical Impressions(s) / UC Diagnoses   Final diagnoses:  Pharyngitis, unspecified etiology  Acute recurrent maxillary sinusitis     Discharge Instructions      Use Flonase 2 times a  day for the first few days then then 1 a day until your symptoms have resolved Take Z-Pak  as directed.  You may discard the remainder of the amoxicillin Make sure you are drinking lots of fluids    ED Prescriptions     Medication Sig Dispense Auth. Provider   azithromycin (ZITHROMAX Z-PAK) 250 MG tablet Take two pills today followed by one a day until gone 6 tablet Eustace Moore, MD   fluticasone [facility]) 50 MCG/ACT nasal spray Place 2 sprays into both nostrils daily. 16 g Eustace Moore, MD      PDMP not reviewed this encounter.   Eustace Moore, MD 04/27/22 [phone]

## 2022-04-26 NOTE — Discharge Instructions (Signed)
Use Flonase 2 times a day for the first few days then then 1 a day until your symptoms have resolved Take Z-Pak as directed.  You may discard the remainder of the amoxicillin Make sure you are drinking lots of fluids

## 2022-04-26 NOTE — ED Triage Notes (Addendum)
Pt c/o sore throat and ear pain x 3 weeks. Was seen last week in UC, tx with amoxicillin and prednisone. Has one amoxicillin pill left. Also taking ibuprofen prn.

## 2022-04-26 NOTE — Telephone Encounter (Signed)
Call back to [facility] regarding medications from today's. Flonase is only for 30 days - pharmacy ok to fill for 30 days not  90. Delay in fill due to pharmacy - Z-pak is ready - pt to go to pharmacy to pick up and can call back here if there are additional questions

## 2022-05-01 ENCOUNTER — Ambulatory Visit
Admission: RE | Admit: 2022-05-01 | Discharge: 2022-05-01 | Disposition: A | Discharge status: Home / Self Care | Payer: BC Managed Care – PPO | Source: Ambulatory Visit | Attending: Family Medicine | Admitting: Family Medicine

## 2022-05-01 VITALS — BP 134/85 | HR 98 | Temp 98.8°F | Resp 12

## 2022-05-01 DIAGNOSIS — J029 Acute pharyngitis, unspecified: Secondary | ICD-10-CM

## 2022-05-01 NOTE — ED Triage Notes (Signed)
Pt presents with c/o continued ear pain and throat pain after completing his z pak this morning

## 2022-05-01 NOTE — Discharge Instructions (Signed)
Follow up as needed

## 2022-05-01 NOTE — ED Provider Notes (Addendum)
[name]    CSN: [id number] Arrival date & time: 05/01/22  1438      History   Chief Complaint Chief Complaint  Patient presents with   Sore Throat    Continued issue, swollen glands, ear pain - Entered by patient   Otalgia    HPI [name] is a 56 y.o. male.   HPI This is the patient's third visit for his upper respiratory infection.  At the first visit he was treated with amoxicillin.  At the second visit I explained to him I thought he had a virus, but it is insistent gave him a Z-Pak.  He is here because he is finished a Z-Pak and is still having symptoms.  Still has sore throat.  Ear pressure and pain.  Is requesting a "stronger antibiotic" Sinus pressure and pain is better.  His cough is better.  He is not having any fever.  He still feels somewhat tired.  His biggest concern is continued throat pain and ear pain Past Medical History:  Diagnosis Date   BPH (benign prostatic hyperplasia)    Chronic groin pain, right    Chronic prostatitis    non-bactrerial , followed by urologist,  takes elmiron   Dermatitis, unspecified    chronic intermittant   GAD (generalized anxiety disorder)    GERD (gastroesophageal reflux disease)    Hemorrhoids    History of anal fissures    History of chronic sinusitis    History of kidney stones    History of repair of hiatal hernia 2007   Lower urinary tract symptoms (LUTS)    Umbilical hernia    Wears contact lenses     Patient Active Problem List   Diagnosis Date Noted   Thrombosed external hemorrhoid 12/27/2012    Past Surgical History:  Procedure Laterality Date   INGUINAL HERNIA REPAIR Right 1992;  1998   LAPAROSCOPIC CHOLECYSTECTOMY  06-30-2001   @[facility]    LAPAROSCOPIC INGUINAL HERNIA REPAIR Left 03-31-2018  @[facility]    LAPAROSCOPIC NISSEN FUNDOPLICATION  02-27-2006   @[facility]    LAPAROSCOPY N/A 04/30/2020   Procedure: LAPAROSCOPY DIAGNOSTIC;  Surgeon: 03-01-2006, MD;  Location: [facility];  Service: General;  Laterality: N/A;   LIPOMA EXCISION  2012   UMBILICAL HERNIA REPAIR N/A 04/30/2020   Procedure: HERNIA REPAIR UMBILICAL ADULT;  Surgeon: 05/02/2020, Quentin Ore, MD;  Location: [facility];  Service: General;  Laterality: N/A;       Home Medications    Prior to Admission medications   Medication Sig Start Date End Date Taking? Authorizing Provider  azithromycin (ZITHROMAX Z-PAK) 250 MG tablet Take two pills today followed by one a day until gone Patient not taking: Reported on 05/01/2022 04/26/22   [facility], MD  fexofenadine (ALLEGRA) 180 MG tablet Take 180 mg by mouth daily.    [provider]  fluticasone (FLONASE) 50 MCG/ACT nasal spray Place 2 sprays into both nostrils daily. 04/26/22   04/28/22, MD  lamoTRIgine (LAMICTAL) 150 MG tablet Take 150 mg by mouth daily.    [provider]  omeprazole-sodium bicarbonate (ZEGERID) 40-1100 MG capsule Take 1 capsule by mouth 2 (two) times daily.    [provider]  oxcarbazepine (TRILEPTAL) 600 MG tablet Take 600-1,800 mg by mouth 2 (two) times daily. Takes on tablet am and two tablet pm    [provider]  pentosan polysulfate (ELMIRON) 100 MG capsule Take 100 mg by mouth 3 (three) times  daily before meals.    [provider]    Family History Family History  Problem Relation Age of Onset   Cancer Father        thyroid and prostate    Social History Social History   Tobacco Use   Smoking status: Former    Years: 20.00    Types: Cigarettes    Quit date: 05/06/2011    Years since quitting: 10.9   Smokeless tobacco: Never  Vaping Use   Vaping Use: Never used  Substance Use Topics   Alcohol use: Not Currently    Comment: rare   Drug use: Never     Allergies   Patient has no known allergies.   Review of Systems Review of Systems  See HPI Physical Exam Triage Vital Signs ED Triage Vitals  Enc Vitals Group     BP  05/01/22 1449 134/85     Pulse Rate 05/01/22 1449 98     Resp 05/01/22 1449 12     Temp 05/01/22 1449 98.8 F (37.1 C)     Temp Source 05/01/22 1449 Oral     SpO2 05/01/22 1449 96 %     Weight --      Height --      Head Circumference --      Peak Flow --      Pain Score 05/01/22 1450 3     Pain Loc --      Pain Edu? --      Excl. in GC? --    No data found.  Updated Vital Signs BP 134/85 (BP Location: Left Arm)   Pulse 98   Temp 98.8 F (37.1 C) (Oral)   Resp 12   SpO2 96%   Physical Exam Constitutional:      General: He is not in acute distress.    Appearance: He is well-developed. He is not ill-appearing.  HENT:     Head: Normocephalic and atraumatic.     Right Ear: Tympanic membrane and ear canal normal.     Left Ear: Tympanic membrane and ear canal normal.     Nose: No congestion or rhinorrhea.     Mouth/Throat:     Pharynx: Posterior oropharyngeal erythema present.     Comments: Cobblestoning in the posterior pharynx with some scant erythema.  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  No tonsil hypertrophy Eyes:     Conjunctiva/sclera: Conjunctivae normal.     Pupils: Pupils are equal, round, and reactive to light.  Cardiovascular:     Rate and Rhythm: Normal rate.  Pulmonary:     Effort: Pulmonary effort is normal. No respiratory distress.  Abdominal:     General: There is no distension.     Palpations: Abdomen is soft.  Musculoskeletal:        General: Normal range of motion.     Cervical back: Normal range of motion.  Lymphadenopathy:     Cervical: Cervical adenopathy present.  Skin:    General: Skin is warm and dry.  Neurological:     Mental Status: He is alert.      UC Treatments / Results  Labs (all labs ordered are listed, but only abnormal results are displayed) Labs Reviewed - No data to display  EKG   Radiology No results found.  Procedures Procedures (including critical care time)  Medications Ordered in  UC Medications - No data to display  Initial Impression / Assessment and Plan / UC Course  I have reviewed  the triage vital signs and the nursing notes.  Pertinent labs & imaging results that were available during my care of the patient were reviewed by me and considered in my medical decision making (see chart for details).     I tried to explain to the patient that his initial infection was a virus.  He was treated with Augmentin for a sinus infection even though it was likely viral, and the hope that it would be helpful for him.  He got a Z-Pak the second time that his insistence, and he firmly believes that antibiotics are going to cure him.  He is resistant to me telling him that I do not think he needs any additional antibiotics.  He became angry and left without his AVS or my final recommendation I did recommend follow-up with his PCP I recommended referral to an ENT I offered to do some screening laboratory work to see if there was another etiology Patient declined all of these Final Clinical Impressions(s) / UC Diagnoses   Final diagnoses:  Pharyngitis, unspecified etiology     Discharge Instructions      Follow up as needed     ED Prescriptions   None    PDMP not reviewed this encounter.   Eustace Moore, MD 05/01/22 1943    Eustace Moore, MD 05/01/22 [phone]

## 2022-05-02 DIAGNOSIS — H6991 Unspecified Eustachian tube disorder, right ear: Secondary | ICD-10-CM | POA: Diagnosis not present

## 2022-05-02 DIAGNOSIS — H6591 Unspecified nonsuppurative otitis media, right ear: Secondary | ICD-10-CM | POA: Diagnosis not present

## 2022-05-02 DIAGNOSIS — F3181 Bipolar II disorder: Secondary | ICD-10-CM | POA: Diagnosis not present

## 2022-05-13 DIAGNOSIS — H6591 Unspecified nonsuppurative otitis media, right ear: Secondary | ICD-10-CM | POA: Diagnosis not present

## 2022-07-01 DIAGNOSIS — H93299 Other abnormal auditory perceptions, unspecified ear: Secondary | ICD-10-CM | POA: Diagnosis not present

## 2022-07-29 DIAGNOSIS — H9313 Tinnitus, bilateral: Secondary | ICD-10-CM | POA: Diagnosis not present

## 2022-10-31 DIAGNOSIS — F3181 Bipolar II disorder: Secondary | ICD-10-CM | POA: Diagnosis not present

## 2022-11-13 DIAGNOSIS — J019 Acute sinusitis, unspecified: Secondary | ICD-10-CM | POA: Diagnosis not present

## 2022-11-13 DIAGNOSIS — R059 Cough, unspecified: Secondary | ICD-10-CM | POA: Diagnosis not present

## 2022-11-13 DIAGNOSIS — J029 Acute pharyngitis, unspecified: Secondary | ICD-10-CM | POA: Diagnosis not present

## 2022-11-13 DIAGNOSIS — Z20822 Contact with and (suspected) exposure to covid-19: Secondary | ICD-10-CM | POA: Diagnosis not present

## 2022-11-17 DIAGNOSIS — E559 Vitamin D deficiency, unspecified: Secondary | ICD-10-CM | POA: Diagnosis not present

## 2022-11-17 DIAGNOSIS — E782 Mixed hyperlipidemia: Secondary | ICD-10-CM | POA: Diagnosis not present

## 2022-11-17 DIAGNOSIS — R7301 Impaired fasting glucose: Secondary | ICD-10-CM | POA: Diagnosis not present

## 2022-11-17 DIAGNOSIS — Z125 Encounter for screening for malignant neoplasm of prostate: Secondary | ICD-10-CM | POA: Diagnosis not present

## 2022-11-17 DIAGNOSIS — Z808 Family history of malignant neoplasm of other organs or systems: Secondary | ICD-10-CM | POA: Diagnosis not present

## 2022-11-29 DIAGNOSIS — J029 Acute pharyngitis, unspecified: Secondary | ICD-10-CM | POA: Diagnosis not present

## 2022-12-02 DIAGNOSIS — K219 Gastro-esophageal reflux disease without esophagitis: Secondary | ICD-10-CM | POA: Diagnosis not present

## 2022-12-02 DIAGNOSIS — Z0000 Encounter for general adult medical examination without abnormal findings: Secondary | ICD-10-CM | POA: Diagnosis not present

## 2022-12-02 DIAGNOSIS — N411 Chronic prostatitis: Secondary | ICD-10-CM | POA: Diagnosis not present

## 2022-12-02 DIAGNOSIS — R21 Rash and other nonspecific skin eruption: Secondary | ICD-10-CM | POA: Diagnosis not present

## 2022-12-02 DIAGNOSIS — E559 Vitamin D deficiency, unspecified: Secondary | ICD-10-CM | POA: Diagnosis not present

## 2022-12-19 ENCOUNTER — Ambulatory Visit: Admission: RE | Admit: 2022-12-19 | Payer: BC Managed Care – PPO | Source: Ambulatory Visit

## 2022-12-19 ENCOUNTER — Other Ambulatory Visit: Payer: Self-pay | Admitting: Family Medicine

## 2022-12-19 DIAGNOSIS — R1013 Epigastric pain: Secondary | ICD-10-CM | POA: Diagnosis not present

## 2022-12-19 DIAGNOSIS — R0600 Dyspnea, unspecified: Secondary | ICD-10-CM

## 2022-12-19 DIAGNOSIS — R059 Cough, unspecified: Secondary | ICD-10-CM | POA: Diagnosis not present

## 2022-12-19 DIAGNOSIS — R0602 Shortness of breath: Secondary | ICD-10-CM | POA: Diagnosis not present

## 2022-12-19 DIAGNOSIS — R0989 Other specified symptoms and signs involving the circulatory and respiratory systems: Secondary | ICD-10-CM | POA: Diagnosis not present

## 2023-03-02 DIAGNOSIS — N411 Chronic prostatitis: Secondary | ICD-10-CM | POA: Diagnosis not present

## 2023-04-27 DIAGNOSIS — F3181 Bipolar II disorder: Secondary | ICD-10-CM | POA: Diagnosis not present

## 2023-07-10 DIAGNOSIS — R030 Elevated blood-pressure reading, without diagnosis of hypertension: Secondary | ICD-10-CM | POA: Diagnosis not present

## 2023-07-10 DIAGNOSIS — R52 Pain, unspecified: Secondary | ICD-10-CM | POA: Diagnosis not present

## 2023-07-10 DIAGNOSIS — H6691 Otitis media, unspecified, right ear: Secondary | ICD-10-CM | POA: Diagnosis not present

## 2023-07-10 DIAGNOSIS — J069 Acute upper respiratory infection, unspecified: Secondary | ICD-10-CM | POA: Diagnosis not present

## 2023-10-09 DIAGNOSIS — L820 Inflamed seborrheic keratosis: Secondary | ICD-10-CM | POA: Diagnosis not present

## 2023-10-09 DIAGNOSIS — L309 Dermatitis, unspecified: Secondary | ICD-10-CM | POA: Diagnosis not present

## 2023-10-26 DIAGNOSIS — F3181 Bipolar II disorder: Secondary | ICD-10-CM | POA: Diagnosis not present

## 2023-12-03 DIAGNOSIS — E559 Vitamin D deficiency, unspecified: Secondary | ICD-10-CM | POA: Diagnosis not present

## 2023-12-03 DIAGNOSIS — Z8349 Family history of other endocrine, nutritional and metabolic diseases: Secondary | ICD-10-CM | POA: Diagnosis not present

## 2023-12-03 DIAGNOSIS — Z0000 Encounter for general adult medical examination without abnormal findings: Secondary | ICD-10-CM | POA: Diagnosis not present

## 2023-12-07 DIAGNOSIS — N411 Chronic prostatitis: Secondary | ICD-10-CM | POA: Diagnosis not present

## 2023-12-07 DIAGNOSIS — Z0000 Encounter for general adult medical examination without abnormal findings: Secondary | ICD-10-CM | POA: Diagnosis not present

## 2023-12-07 DIAGNOSIS — K649 Unspecified hemorrhoids: Secondary | ICD-10-CM | POA: Diagnosis not present

## 2023-12-07 DIAGNOSIS — K219 Gastro-esophageal reflux disease without esophagitis: Secondary | ICD-10-CM | POA: Diagnosis not present

## 2023-12-07 DIAGNOSIS — L409 Psoriasis, unspecified: Secondary | ICD-10-CM | POA: Diagnosis not present

## 2024-02-07 DIAGNOSIS — S61432A Puncture wound without foreign body of left hand, initial encounter: Secondary | ICD-10-CM | POA: Diagnosis not present

## 2024-02-07 DIAGNOSIS — S61452A Open bite of left hand, initial encounter: Secondary | ICD-10-CM | POA: Diagnosis not present

## 2024-02-07 DIAGNOSIS — M79645 Pain in left finger(s): Secondary | ICD-10-CM | POA: Diagnosis not present

## 2024-02-07 DIAGNOSIS — S61251A Open bite of left index finger without damage to nail, initial encounter: Secondary | ICD-10-CM | POA: Diagnosis not present

## 2024-02-07 DIAGNOSIS — W5501XA Bitten by cat, initial encounter: Secondary | ICD-10-CM | POA: Diagnosis not present

## 2024-02-07 DIAGNOSIS — S61231A Puncture wound without foreign body of left index finger without damage to nail, initial encounter: Secondary | ICD-10-CM | POA: Diagnosis not present

## 2024-02-07 DIAGNOSIS — Z23 Encounter for immunization: Secondary | ICD-10-CM | POA: Diagnosis not present

## 2024-03-15 ENCOUNTER — Encounter: Payer: Self-pay | Admitting: Cardiovascular Disease

## 2024-03-15 ENCOUNTER — Ambulatory Visit (HOSPITAL_COMMUNITY)
Admission: RE | Admit: 2024-03-15 | Discharge: 2024-03-15 | Disposition: A | Discharge status: Home / Self Care | Payer: Self-pay | Source: Ambulatory Visit | Attending: Cardiovascular Disease | Admitting: Cardiovascular Disease

## 2024-03-15 ENCOUNTER — Ambulatory Visit: Attending: Cardiovascular Disease | Admitting: Cardiovascular Disease

## 2024-03-15 VITALS — BP 144/98 | HR 77 | Ht 73.0 in | Wt 203.0 lb

## 2024-03-15 DIAGNOSIS — Z8249 Family history of ischemic heart disease and other diseases of the circulatory system: Secondary | ICD-10-CM

## 2024-03-15 DIAGNOSIS — R002 Palpitations: Secondary | ICD-10-CM

## 2024-03-15 DIAGNOSIS — E782 Mixed hyperlipidemia: Secondary | ICD-10-CM | POA: Insufficient documentation

## 2024-03-15 DIAGNOSIS — Z7689 Persons encountering health services in other specified circumstances: Secondary | ICD-10-CM

## 2024-03-15 DIAGNOSIS — I10 Essential (primary) hypertension: Secondary | ICD-10-CM | POA: Diagnosis not present

## 2024-03-15 MED ORDER — TELMISARTAN 20 MG PO TABS: 20.0000 mg | ORAL_TABLET | Freq: Every day | ORAL | 3 refills | Status: DC

## 2024-03-15 NOTE — Patient Instructions (Addendum)
 Medication Instructions:  START Telmisartan (Micardis) 20 mg once daily   **Send in a blood pressure log in about 1 month to our office for us  to see what your pressures are trending**  *If you need a refill on your cardiac medications before your next appointment, please call your pharmacy*  Lab Work: To be completed when you come in for an echo: BMP  If you have labs (blood work) drawn today and your tests are completely normal, you will receive your results only by: MyChart Message (if you have MyChart) OR A paper copy in the mail If you have any lab test that is abnormal or we need to change your treatment, we will call you to review the results.  Testing/Procedures: Your physician has requested that you have a coronary calcium score performed. This is not covered by insurance and will be an out-of-pocket cost of approximately $99.   Your physician has requested that you have an echocardiogram. Echocardiography is a painless test that uses sound waves to create images of your heart. It provides your doctor with information about the size and shape of your heart and how well your heart's chambers and valves are working. This procedure takes approximately one hour. There are no restrictions for this procedure. Please do NOT wear cologne, perfume, aftershave, or lotions (deodorant is allowed). Please arrive 15 minutes prior to your appointment time.  Please note: We ask at that you not bring children with you during ultrasound (echo/ vascular) testing. Due to room size and safety concerns, children are not allowed in the ultrasound rooms during exams. Our front office staff cannot provide observation of children in our lobby area while testing is being conducted. An adult accompanying a patient to their appointment will only be allowed in the ultrasound room at the discretion of the ultrasound technician under special circumstances. We apologize for any inconvenience.   Follow-Up: At [facility], you and your health needs are our priority.  As part of our continuing mission to provide you with exceptional heart care, our providers are all part of one team.  This team includes your primary Cardiologist (physician) and Advanced Practice Providers or APPs (Physician Assistants and Nurse Practitioners) who all work together to provide you with the care you need, when you need it.  Your next appointment:   1 year(s)  Provider:   Ozell Fell, MD

## 2024-03-15 NOTE — Progress Notes (Signed)
 Cardiology Office Note:    Date:  03/15/2024   ID:  [name], DOB [birth year], MRN [id number]  PCP:  Okey Carlin Redbird, MD   [facility] Providers Cardiologist:  None     Referring MD: Okey Carlin Redbird, MD   Chief Complaint  Patient presents with   Palpitations    History of Present Illness:    [name] is a 58 y.o. male presenting for initial cardiac evaluation. He has been concerned about his family history of heart disease. His father had mitral regurgitation requiring surgical valve replacement in his 53's. The patient complains of episodic 'fluttering' and 'skipping' of his heart.  These have been present for some time with no recent change.  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 He doesn't exercise regularly. No shortness of breath. Occasionally reports a left sided pain around the left ribcage over the past few years, lasting up to a few days.  No exertional chest pain or pressure.  Past Medical History:  Diagnosis Date   BPH (benign prostatic hyperplasia)    Chronic groin pain, right    Chronic prostatitis    non-bactrerial , followed by urologist,  takes elmiron   Dermatitis, unspecified    chronic intermittant   GAD (generalized anxiety disorder)    GERD (gastroesophageal reflux disease)    Hemorrhoids    History of anal fissures    History of chronic sinusitis    History of kidney stones    History of repair of hiatal hernia 2007   Lower urinary tract symptoms (LUTS)    Umbilical hernia    Wears contact lenses    Past Surgical History:  Procedure Laterality Date   INGUINAL HERNIA REPAIR Right 1992;  1998   LAPAROSCOPIC CHOLECYSTECTOMY  06-30-2001   @[facility]    LAPAROSCOPIC INGUINAL HERNIA REPAIR Left 03-31-2018  @[facility]    LAPAROSCOPIC NISSEN FUNDOPLICATION  02-27-2006   @[facility]    LAPAROSCOPY N/A 04/30/2020   Procedure: LAPAROSCOPY DIAGNOSTIC;  Surgeon: Lyndel Deward PARAS, MD;  Location: [facility];  Service: General;  Laterality: N/A;   LIPOMA EXCISION  2012   UMBILICAL HERNIA REPAIR N/A 04/30/2020   Procedure: HERNIA REPAIR UMBILICAL ADULT;  Surgeon: Lyndel, Deward PARAS, MD;  Location:  SURGERY CENTER;  Service: General;  Laterality: N/A;      Current Medications: Current Meds  Medication Sig   fexofenadine (ALLEGRA) 180 MG tablet Take 180 mg by mouth daily.   fluticasone  (FLONASE ) 50 MCG/ACT nasal spray Place 2 sprays into both nostrils daily.   lamoTRIgine (LAMICTAL) 150 MG tablet Take 150 mg by mouth daily.   omeprazole-sodium bicarbonate (ZEGERID) 40-1100 MG capsule Take 1 capsule by mouth 2 (two) times daily.   oxcarbazepine (TRILEPTAL) 600 MG tablet Take 600-1,800 mg by mouth 2 (two) times daily. Takes on tablet am and two tablet pm   pentosan polysulfate (ELMIRON) 100 MG capsule Take 100 mg by mouth 3 (three) times daily before meals.   telmisartan (MICARDIS) 20 MG tablet Take 1 tablet (20 mg total) by mouth daily.     Allergies:   Patient has no known allergies.   ROS:   Please see the history of present illness.    All other systems reviewed and are negative.  EKGs/Labs/Other Studies Reviewed:    The following studies were reviewed today: Cardiac Studies & Procedures   ______________________________________________________________________________________________          CT SCANS  CT CARDIAC SCORING (SELF  PAY ONLY) 03/15/2024  Narrative CLINICAL DATA:  Cardiovascular Disease Risk stratification  EXAM: Coronary Calcium Score  TECHNIQUE: A gated, non-contrast computed tomography scan of the heart was performed using 3mm slice thickness. Axial images were analyzed on a dedicated workstation. Calcium scoring of the coronary arteries was performed using the Agatston method.  FINDINGS: Coronary arteries: Normal origins.  Coronary Calcium Score:  Left main: 0  Left anterior descending artery: 50.6  Left circumflex artery:  24.4  Right coronary artery: 0  Total: 75  Percentile: 76th  Pericardium: Normal.  Aorta: Normal caliber of ascending aorta. No aortic atherosclerosis noted.  Non-cardiac: See separate report from [facility].  IMPRESSION: Coronary calcium score of 75. This was 76th percentile for age-, race-, and sex-matched controls.  RECOMMENDATIONS: Coronary artery calcium (CAC) score is a strong predictor of incident coronary heart disease (CHD) and provides predictive information beyond traditional risk factors. CAC scoring is reasonable to use in the decision to withhold, postpone, or initiate statin therapy in intermediate-risk or selected borderline-risk asymptomatic adults (age 90-75 years and LDL-C >=70 to <190 mg/dL) who do not have diabetes or established atherosclerotic cardiovascular disease (ASCVD).* In intermediate-risk (10-year ASCVD risk >=7.5% to <20%) adults or selected borderline-risk (10-year ASCVD risk >=5% to <7.5%) adults in whom a CAC score is measured for the purpose of making a treatment decision the following recommendations have been made:  If CAC=0, it is reasonable to withhold statin therapy and reassess in 5 to 10 years, as long as higher risk conditions are absent (diabetes mellitus, family history of premature CHD in first degree relatives (males <55 years; females <65 years), cigarette smoking, or LDL >=190 mg/dL).  If CAC is 1 to 99, it is reasonable to initiate statin therapy for patients >=40 years of age.  If CAC is >=100 or >=75th percentile, it is reasonable to initiate statin therapy at any age.  Cardiology referral should be considered for patients with CAC scores >=400 or >=75th percentile.  *2018 AHA/ACC/AACVPR/AAPA/ABC/ACPM/ADA/AGS/APhA/ASPC/NLA/PCNA Guideline on the Management of Blood Cholesterol: A Report of the American College of Cardiology/American Heart Association Task Force on Clinical Practice Guidelines. J Am Coll  Cardiol. 2019;73(24):3168-3209.  Shelda Bruckner, MD   Electronically Signed By: Shelda Bruckner M.D. On: 03/15/2024 16:42     ______________________________________________________________________________________________      EKG:   EKG Interpretation Date/Time:  Tuesday March 15 2024 14:50:59 EST Ventricular Rate:  77 PR Interval:  160 QRS Duration:  88 QT Interval:  360 QTC Calculation: 407 R Axis:   -8  Text Interpretation: Normal sinus rhythm with sinus arrhythmia Normal ECG When compared with ECG of 01-Aug-2008 06:50, Nonspecific T wave abnormality now present Confirmed by Wonda Sharper [phone]) on 03/15/2024 3:05:41 PM    Recent Labs: No results found for requested labs within last 365 days.  Recent Lipid Panel No results found for: CHOL, TRIG, HDL, CHOLHDL, VLDL, LDLCALC, LDLDIRECT        Physical Exam:    VS:  BP (!) 144/98 (BP Location: Right Arm, Patient Position: Sitting, Cuff Size: Normal)   Pulse 77   Ht 6' 1 (1.854 m)   Wt 203 lb (92.1 kg)   SpO2 98%   BMI 26.78 kg/m     Wt Readings from Last 3 Encounters:  03/15/24 203 lb (92.1 kg)  07/28/21 205 lb (93 kg)  06/07/21 205 lb (93 kg)     GEN:  Well nourished, well developed in no acute distress HEENT: Normal NECK: No JVD; No carotid  bruits LYMPHATICS: No lymphadenopathy CARDIAC: RRR, no murmurs, rubs, gallops RESPIRATORY:  Clear to auscultation without rales, wheezing or rhonchi  ABDOMEN: Soft, non-tender, non-distended MUSCULOSKELETAL:  No edema; No deformity  SKIN: Warm and dry NEUROLOGIC:  Alert and oriented x 3 PSYCHIATRIC:  Normal affect   Assessment & Plan Family history of CHF (congestive heart failure) 2D echo, see below Encounter to establish care I plan to see him back in 1 year for follow-up evaluation.  Important that we treat his hypertension, evaluate him with an echocardiogram and a coronary CT calcium score. Mixed hyperlipidemia LDL  cholesterol is 111.  Check coronary CT calcium score.  Started on a statin if he has a nonzero calcium score. Essential hypertension Patient brings in home blood pressure readings.  They are averaging 134/96 mmHg and up to 143/101 mmHg.  Patient needs a pharmacotherapy and I have recommended telmisartan 20 mg daily.  I asked him to keep a blood pressure log and send readings into me in about 4 weeks so that we can adjust as needed. Palpitations Mild, stable symptom.  Recommend 2D echocardiogram to make sure the cardiac structure and function are normal.     Medication Adjustments/Labs and Tests Ordered: Current medicines are reviewed at length with the patient today.  Concerns regarding medicines are outlined above.  Orders Placed This Encounter  Procedures   CT CARDIAC SCORING (SELF PAY ONLY)   Basic metabolic panel with GFR   EKG [id number]   ECHOCARDIOGRAM COMPLETE   Meds ordered this encounter  Medications   telmisartan (MICARDIS) 20 MG tablet    Sig: Take 1 tablet (20 mg total) by mouth daily.    Dispense:  30 tablet    Refill:  3    Patient Instructions  Medication Instructions:  START Telmisartan (Micardis) 20 mg once daily   **Send in a blood pressure log in about 1 month to our office for us  to see what your pressures are trending**  *If you need a refill on your cardiac medications before your next appointment, please call your pharmacy*  Lab Work: To be completed when you come in for an echo: BMP  If you have labs (blood work) drawn today and your tests are completely normal, you will receive your results only by: MyChart Message (if you have MyChart) OR A paper copy in the mail If you have any lab test that is abnormal or we need to change your treatment, we will call you to review the results.  Testing/Procedures: Your physician has requested that you have a coronary calcium score performed. This is not covered by insurance and will be an out-of-pocket cost of  approximately $99.   Your physician has requested that you have an echocardiogram. Echocardiography is a painless test that uses sound waves to create images of your heart. It provides your doctor with information about the size and shape of your heart and how well your heart's chambers and valves are working. This procedure takes approximately one hour. There are no restrictions for this procedure. Please do NOT wear cologne, perfume, aftershave, or lotions (deodorant is allowed). Please arrive 15 minutes prior to your appointment time.  Please note: We ask at that you not bring children with you during ultrasound (echo/ vascular) testing. Due to room size and safety concerns, children are not allowed in the ultrasound rooms during exams. Our front office staff cannot provide observation of children in our lobby area while testing is being conducted. An adult accompanying a patient to  their appointment will only be allowed in the ultrasound room at the discretion of the ultrasound technician under special circumstances. We apologize for any inconvenience.   Follow-Up: At [facility], you and your health needs are our priority.  As part of our continuing mission to provide you with exceptional heart care, our providers are all part of one team.  This team includes your primary Cardiologist (physician) and Advanced Practice Providers or APPs (Physician Assistants and Nurse Practitioners) who all work together to provide you with the care you need, when you need it.  Your next appointment:   1 year(s)  Provider:   Ozell Fell, MD      Signed, [name] Fell, MD  03/15/2024 5:34 PM    [facility]

## 2024-03-17 ENCOUNTER — Ambulatory Visit: Payer: Self-pay | Admitting: Cardiovascular Disease

## 2024-03-17 DIAGNOSIS — E7800 Pure hypercholesterolemia, unspecified: Secondary | ICD-10-CM

## 2024-03-17 MED ORDER — ROSUVASTATIN CALCIUM 10 MG PO TABS: 10.0000 mg | ORAL_TABLET | Freq: Every day | ORAL | 3 refills | Status: DC

## 2024-04-04 MED ORDER — AMLODIPINE BESYLATE 5 MG PO TABS: 5.0000 mg | ORAL_TABLET | Freq: Every day | ORAL | 3 refills | Status: AC

## 2024-04-25 ENCOUNTER — Ambulatory Visit (HOSPITAL_COMMUNITY)
Admission: RE | Admit: 2024-04-25 | Discharge: 2024-04-25 | Disposition: A | Discharge status: Home / Self Care | Source: Ambulatory Visit | Attending: Cardiovascular Disease | Admitting: Cardiovascular Disease

## 2024-04-25 DIAGNOSIS — R002 Palpitations: Secondary | ICD-10-CM | POA: Diagnosis not present

## 2024-04-25 DIAGNOSIS — I10 Essential (primary) hypertension: Secondary | ICD-10-CM

## 2024-04-25 DIAGNOSIS — E7800 Pure hypercholesterolemia, unspecified: Secondary | ICD-10-CM | POA: Diagnosis not present

## 2024-04-25 DIAGNOSIS — F3181 Bipolar II disorder: Secondary | ICD-10-CM | POA: Diagnosis not present

## 2024-04-25 LAB — ECHOCARDIOGRAM COMPLETE
Area-P 1/2: 2.95 cm2
S' Lateral: 2.8 cm

## 2024-04-25 LAB — BASIC METABOLIC PANEL WITH GFR
BUN/Creatinine Ratio: 14 (ref 9–20)
BUN: 14 mg/dL (ref 6–24)
CO2: 24 mmol/L (ref 20–29)
Calcium: 9.9 mg/dL (ref 8.7–10.2)
Chloride: 100 mmol/L (ref 96–106)
Creatinine, Ser: 0.97 mg/dL (ref 0.76–1.27)
Glucose: 92 mg/dL (ref 70–99)
Potassium: 4.1 mmol/L (ref 3.5–5.2)
Sodium: 139 mmol/L (ref 134–144)
eGFR: 92 mL/min/1.73

## 2024-04-25 LAB — LIPID PANEL
Chol/HDL Ratio: 4.4 ratio (ref 0.0–5.0)
Cholesterol, Total: 236 mg/dL — ABNORMAL HIGH (ref 100–199)
HDL: 54 mg/dL
LDL Chol Calc (NIH): 155 mg/dL — ABNORMAL HIGH (ref 0–99)
Triglycerides: 152 mg/dL — ABNORMAL HIGH (ref 0–149)
VLDL Cholesterol Cal: 27 mg/dL (ref 5–40)

## 2024-04-25 LAB — HEPATIC FUNCTION PANEL
ALT: 32 IU/L (ref 0–44)
AST: 23 IU/L (ref 0–40)
Albumin: 4.8 g/dL (ref 3.8–4.9)
Alkaline Phosphatase: 48 IU/L (ref 47–123)
Bilirubin Total: 0.4 mg/dL (ref 0.0–1.2)
Bilirubin, Direct: 0.13 mg/dL (ref 0.00–0.40)
Total Protein: 6.8 g/dL (ref 6.0–8.5)

## 2024-05-02 ENCOUNTER — Other Ambulatory Visit: Payer: Self-pay

## 2024-05-02 DIAGNOSIS — E7800 Pure hypercholesterolemia, unspecified: Secondary | ICD-10-CM

## 2024-05-02 MED ORDER — ATORVASTATIN CALCIUM 20 MG PO TABS: 20.0000 mg | ORAL_TABLET | Freq: Every day | ORAL | 3 refills | Status: AC

## 2024-05-09 DIAGNOSIS — I10 Essential (primary) hypertension: Secondary | ICD-10-CM

## 2024-05-09 MED ORDER — TELMISARTAN 20 MG PO TABS: 20.0000 mg | ORAL_TABLET | Freq: Every day | ORAL | 3 refills | Status: AC
